# Patient Record
Sex: Male | Born: 1935 | Race: White | Hispanic: No | Marital: Single | Smoking: Never smoker
Health system: Southern US, Community
[De-identification: ages and names within clinical notes are randomized; demographics above are authoritative.]

## PROBLEM LIST (undated history)

## (undated) DIAGNOSIS — D689 Coagulation defect, unspecified: Secondary | ICD-10-CM

## (undated) DIAGNOSIS — I10 Essential (primary) hypertension: Secondary | ICD-10-CM

## (undated) DIAGNOSIS — N138 Other obstructive and reflux uropathy: Secondary | ICD-10-CM

## (undated) DIAGNOSIS — M1990 Unspecified osteoarthritis, unspecified site: Secondary | ICD-10-CM

## (undated) DIAGNOSIS — H269 Unspecified cataract: Secondary | ICD-10-CM

## (undated) DIAGNOSIS — K759 Inflammatory liver disease, unspecified: Secondary | ICD-10-CM

## (undated) DIAGNOSIS — K838 Other specified diseases of biliary tract: Secondary | ICD-10-CM

## (undated) DIAGNOSIS — N401 Enlarged prostate with lower urinary tract symptoms: Secondary | ICD-10-CM

## (undated) HISTORY — DX: Unspecified cataract: H26.9

## (undated) HISTORY — DX: Essential (primary) hypertension: I10

## (undated) HISTORY — DX: Coagulation defect, unspecified: D68.9

## (undated) HISTORY — DX: Other specified diseases of biliary tract: K83.8

## (undated) HISTORY — PX: OTHER SURGICAL HISTORY: SHX169

## (undated) HISTORY — DX: Benign prostatic hyperplasia with lower urinary tract symptoms: N40.1

## (undated) HISTORY — PX: HERNIA REPAIR: SHX51

## (undated) HISTORY — PX: EYE SURGERY: SHX253

## (undated) HISTORY — PX: CHOLECYSTECTOMY: SHX55

## (undated) HISTORY — DX: Unspecified osteoarthritis, unspecified site: M19.90

## (undated) HISTORY — DX: Other disorders of bilirubin metabolism: E80.6

## (undated) HISTORY — DX: Benign prostatic hyperplasia with lower urinary tract symptoms: N13.8

---

## 1998-09-30 ENCOUNTER — Encounter: Payer: Self-pay | Admitting: Family Medicine

## 1998-09-30 ENCOUNTER — Ambulatory Visit (HOSPITAL_COMMUNITY): Admission: RE | Admit: 1998-09-30 | Discharge: 1998-09-30 | Payer: Self-pay | Admitting: Family Medicine

## 2004-11-11 ENCOUNTER — Ambulatory Visit: Payer: Self-pay | Admitting: Gastroenterology

## 2004-11-19 ENCOUNTER — Ambulatory Visit: Payer: Self-pay | Admitting: Gastroenterology

## 2004-12-08 ENCOUNTER — Ambulatory Visit: Payer: Self-pay | Admitting: Oncology

## 2005-06-01 ENCOUNTER — Ambulatory Visit: Payer: Self-pay | Admitting: Oncology

## 2005-09-02 ENCOUNTER — Ambulatory Visit: Payer: Self-pay | Admitting: Oncology

## 2005-11-16 ENCOUNTER — Ambulatory Visit: Payer: Self-pay | Admitting: Oncology

## 2006-02-08 ENCOUNTER — Ambulatory Visit: Payer: Self-pay | Admitting: Oncology

## 2006-05-03 ENCOUNTER — Ambulatory Visit: Payer: Self-pay | Admitting: Oncology

## 2006-07-26 ENCOUNTER — Ambulatory Visit: Payer: Self-pay | Admitting: Oncology

## 2006-11-01 ENCOUNTER — Ambulatory Visit: Payer: Self-pay | Admitting: Oncology

## 2007-02-28 ENCOUNTER — Ambulatory Visit: Payer: Self-pay | Admitting: Oncology

## 2012-09-29 ENCOUNTER — Encounter: Payer: Self-pay | Admitting: Gastroenterology

## 2014-08-03 ENCOUNTER — Encounter: Payer: Self-pay | Admitting: Gastroenterology

## 2014-08-29 ENCOUNTER — Encounter: Payer: Self-pay | Admitting: Gastroenterology

## 2014-10-03 ENCOUNTER — Ambulatory Visit: Payer: Self-pay | Admitting: Gastroenterology

## 2014-10-03 ENCOUNTER — Encounter: Payer: Self-pay | Admitting: Gastroenterology

## 2014-10-03 ENCOUNTER — Ambulatory Visit (INDEPENDENT_AMBULATORY_CARE_PROVIDER_SITE_OTHER): Payer: 59 | Admitting: Gastroenterology

## 2014-10-03 ENCOUNTER — Other Ambulatory Visit (INDEPENDENT_AMBULATORY_CARE_PROVIDER_SITE_OTHER): Payer: 59

## 2014-10-03 VITALS — BP 120/82 | HR 66 | Ht 73.0 in | Wt 165.4 lb

## 2014-10-03 DIAGNOSIS — R932 Abnormal findings on diagnostic imaging of liver and biliary tract: Secondary | ICD-10-CM

## 2014-10-03 DIAGNOSIS — Z1211 Encounter for screening for malignant neoplasm of colon: Secondary | ICD-10-CM

## 2014-10-03 LAB — BUN: BUN: 14 mg/dL (ref 6–23)

## 2014-10-03 LAB — CREATININE, SERUM: Creatinine, Ser: 1 mg/dL (ref 0.4–1.5)

## 2014-10-03 MED ORDER — PEG-KCL-NACL-NASULF-NA ASC-C 100 G PO SOLR
1.0000 | Freq: Once | ORAL | Status: AC
Start: 2014-10-03 — End: 2014-11-02

## 2014-10-03 NOTE — Progress Notes (Signed)
    History of Present Illness: This is a 80 year old male accompanied by his wife. He is referred by Dr. Nyra Capes.  He underwent colonoscopy in December 2005 for Hemoccult-positive stool showing internal hemorrhoids and mild sigmoid diverticulosis. Recently on routine blood work she was found to have a very mild indirect hyperbilirubinemia. Minimally elevated total bilirubin of 1.7 and a normal direct bilirubin 0.34. The rest of his liver tests were entirely normal. Abd/pelvic CT scan imaging performed in August 2015 at [facility] showed a slight prominence of the intrahepatic and extrahepatic biliary ducts and a prior cholecystectomy. Common hepatic duct measured 12 mm, common bile duct 8-9 mm. He has mild constipation and rare episodes of diarrhea. His constipation has been controlled with regular use of Colace. Denies weight loss, abdominal pain, change in stool caliber, melena, hematochezia, nausea, vomiting, dysphagia, reflux symptoms, chest pain.   Review of Systems: Pertinent positive and negative review of systems were noted in the above HPI section. All other review of systems were otherwise negative. Current Medications, Allergies, Past Medical History, Past Surgical History, Family History and Social History were reviewed in [employer] record.  Physical Exam: General: Well developed , well nourished, no acute distress Head: Normocephalic and atraumatic Eyes:  sclerae anicteric, EOMI Ears: Normal auditory acuity Mouth: No deformity or lesions Neck: Supple, no masses or thyromegaly Lungs: Clear throughout to auscultation Heart: Regular rate and rhythm; no murmurs, rubs or bruits Abdomen: Soft, non tender and non distended. No masses, hepatosplenomegaly or hernias noted. Normal Bowel sounds Rectal: deferred to colonoscopy Musculoskeletal: Symmetrical with no gross deformities  Skin: No lesions on visible extremities Pulses:  Normal pulses noted Extremities:  No clubbing, cyanosis, edema or deformities noted Neurological: Alert oriented x 4, grossly nonfocal Cervical Nodes:  No significant cervical adenopathy Inguinal Nodes: No significant inguinal adenopathy Psychological:  Alert and cooperative. Normal mood and affect  Assessment and Recommendations:  1. Abnormal CT scan with a mildly dilated biliary tree. Post cholecystectomy. 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 Rule out obstructing strictures or lesions leading to biliary dilatation. Proceed with MRI/MRCP.  2. Colorectal cancer screening, average risk. Schedule colonoscopy. The risks, benefits, and alternatives to colonoscopy with possible biopsy and possible polypectomy were discussed with the patient and they consent to proceed.

## 2014-10-03 NOTE — Patient Instructions (Signed)
You have been scheduled for an MRI at [facility] on 10/12/14. Your appointment time is 5:00. Please arrive 15 minutes prior to your appointment time for registration purposes. Please make certain not to have anything to eat or drink 6 hours prior to your test. In addition, if you have any metal in your body, have a pacemaker or defibrillator, please be sure to let your ordering physician know. This test typically takes 45 minutes to 1 hour to complete.  Please go to the basement today for blood work  You have been scheduled for a colonoscopy, please see the separate instructions for details  Thank you for choosing Dr. Fuller Plan and [employer]

## 2014-10-04 ENCOUNTER — Encounter: Payer: Self-pay | Admitting: Gastroenterology

## 2014-10-12 ENCOUNTER — Other Ambulatory Visit (HOSPITAL_COMMUNITY): Payer: 59

## 2014-10-15 ENCOUNTER — Ambulatory Visit (HOSPITAL_COMMUNITY)
Admission: RE | Admit: 2014-10-15 | Discharge: 2014-10-15 | Disposition: A | Discharge status: Home / Self Care | Payer: No Typology Code available for payment source | Source: Ambulatory Visit | Attending: Gastroenterology | Admitting: Gastroenterology

## 2014-10-15 ENCOUNTER — Other Ambulatory Visit: Payer: Self-pay | Admitting: Gastroenterology

## 2014-10-15 DIAGNOSIS — K5900 Constipation, unspecified: Secondary | ICD-10-CM | POA: Diagnosis not present

## 2014-10-15 DIAGNOSIS — R932 Abnormal findings on diagnostic imaging of liver and biliary tract: Secondary | ICD-10-CM

## 2014-10-15 DIAGNOSIS — N281 Cyst of kidney, acquired: Secondary | ICD-10-CM | POA: Diagnosis not present

## 2014-10-15 DIAGNOSIS — K838 Other specified diseases of biliary tract: Secondary | ICD-10-CM | POA: Diagnosis not present

## 2014-10-15 DIAGNOSIS — R197 Diarrhea, unspecified: Secondary | ICD-10-CM | POA: Insufficient documentation

## 2014-10-15 MED ORDER — GADOBENATE DIMEGLUMINE 529 MG/ML IV SOLN
15.0000 mL | Freq: Once | INTRAVENOUS | Status: AC | PRN
  Administered 2014-10-15: 15 mL via INTRAVENOUS

## 2014-10-17 ENCOUNTER — Telehealth: Payer: Self-pay | Admitting: Gastroenterology

## 2014-10-17 ENCOUNTER — Encounter: Payer: 59 | Admitting: Gastroenterology

## 2014-10-17 NOTE — Telephone Encounter (Signed)
All questions answered

## 2014-10-17 NOTE — Telephone Encounter (Signed)
Left message for patient to call back  

## 2014-10-22 ENCOUNTER — Other Ambulatory Visit (HOSPITAL_COMMUNITY): Payer: 59

## 2014-11-27 ENCOUNTER — Ambulatory Visit (AMBULATORY_SURGERY_CENTER): Payer: 59 | Admitting: Gastroenterology

## 2014-11-27 ENCOUNTER — Encounter: Payer: Self-pay | Admitting: Gastroenterology

## 2014-11-27 VITALS — BP 128/71 | HR 54 | Temp 95.9°F | Resp 14 | Ht 73.0 in | Wt 165.0 lb

## 2014-11-27 DIAGNOSIS — Z1211 Encounter for screening for malignant neoplasm of colon: Secondary | ICD-10-CM

## 2014-11-27 MED ORDER — SODIUM CHLORIDE 0.9 % IV SOLN: 500.0000 mL | INTRAVENOUS | Status: DC

## 2014-11-27 NOTE — Patient Instructions (Signed)
YOU HAD AN ENDOSCOPIC PROCEDURE TODAY AT [facility]: Refer to the procedure report that was given to you for any specific questions about what was found during the examination.  If the procedure report does not answer your questions, please call your gastroenterologist to clarify.  If you requested that your care partner not be given the details of your procedure findings, then the procedure report has been included in a sealed envelope for you to review at your convenience later.  YOU SHOULD EXPECT: Some feelings of bloating in the abdomen. Passage of more gas than usual.  Walking can help get rid of the air that was put into your GI tract during the procedure and reduce the bloating. If you had a lower endoscopy (such as a colonoscopy or flexible sigmoidoscopy) you may notice spotting of blood in your stool or on the toilet paper. If you underwent a bowel prep for your procedure, then you may not have a normal bowel movement for a few days.  DIET: Your first meal following the procedure should be a light meal and then it is ok to progress to your normal diet.  A half-sandwich or bowl of soup is an example of a good first meal.  Heavy or fried foods are harder to digest and may make you feel nauseous or bloated.  Likewise meals heavy in dairy and vegetables can cause extra gas to form and this can also increase the bloating.  Drink plenty of fluids but you should avoid alcoholic beverages for 24 hours.  ACTIVITY: Your care partner should take you home directly after the procedure.  You should plan to take it easy, moving slowly for the rest of the day.  You can resume normal activity the day after the procedure however you should NOT DRIVE or use heavy machinery for 24 hours (because of the sedation medicines used during the test).    SYMPTOMS TO REPORT IMMEDIATELY: A gastroenterologist can be reached at any hour.  During normal business hours, 8:30 AM to 5:00 PM Monday through Friday,  call [phone].  After hours and on weekends, please call the GI answering service at [phone] who will take a message and have the physician on call contact you.   Following lower endoscopy (colonoscopy or flexible sigmoidoscopy):  Excessive amounts of blood in the stool  Significant tenderness or worsening of abdominal pains  Swelling of the abdomen that is new, acute  Fever of 100F or higher  FOLLOW UP: Our staff will call the home number listed on your records the next business day following your procedure to check on you and address any questions or concerns that you may have at that time regarding the information given to you following your procedure. This is a courtesy call and so if there is no answer at the home number and we have not heard from you through the emergency physician on call, we will assume that you have returned to your regular daily activities without incident.  SIGNATURES/CONFIDENTIALITY: You and/or your care partner have signed paperwork which will be entered into your electronic medical record.  These signatures attest to the fact that that the information above on your After Visit Summary has been reviewed and is understood.  Full responsibility of the confidentiality of this discharge information lies with you and/or your care-partner.  Please read over handouts about diverticulosis, hemorrhoids and high fiber diets  Push fluids  Continue your normal medications

## 2014-11-27 NOTE — Op Note (Addendum)
[place]  [address] [place], [id number]   COLONOSCOPY PROCEDURE REPORT  PATIENT: Bradley Vasquez, Bradley Vasquez  MR#: [id number] BIRTHDATE: [birth year] , 81  yrs. old GENDER: male ENDOSCOPIST: Ladene Artist, MD, [facility] PROCEDURE DATE:  11/27/2014 PROCEDURE:   Colonoscopy, screening First Screening Colonoscopy - Avg.  risk and is 50 yrs.  old or older - No.  Prior Negative Screening - Now for repeat screening. 10 or more years since last screening  History of Adenoma - Now for follow-up colonoscopy & has been > or = to 3 yrs.  N/A  Polyps Removed Today? No.  Polyps Removed Today? No.  Recommend repeat exam, <10 yrs? Polyps Removed Today? No.  Recommend repeat exam, <10 yrs? No. ASA CLASS:   Class II INDICATIONS:average risk for colorectal cancer. MEDICATIONS: Monitored anesthesia care and Propofol 160 mg IV DESCRIPTION OF PROCEDURE:   After the risks benefits and alternatives of the procedure were thoroughly explained, informed consent was obtained.  The digital rectal exam revealed no abnormalities of the rectum.   The LB [id number]  endoscope was introduced through the anus and advanced to the cecum, which was identified by both the appendix and ileocecal valve. No adverse events experienced.   The quality of the prep was good, using MoviPrep  The instrument was then slowly withdrawn as the colon was fully examined.    COLON FINDINGS: There was moderate diverticulosis noted in the descending colon and sigmoid colon with associated muscular hypertrophy.  Retroflexed views revealed internal Grade II hemorrhoids. The time to cecum=2 minutes 17 seconds.  Withdrawal time=8 minutes 08 seconds.  The scope was withdrawn and the procedure completed. COMPLICATIONS: There were no immediate complications.  ENDOSCOPIC IMPRESSION: 1. Moderate diverticulosis in the descending colon and sigmoid colon  2. Grade II internal hemorrhoids  RECOMMENDATIONS: 1.  High fiber diet  with liberal fluid intake. 2.  Given your age, you will not need another colonoscopy for colon cancer screening or polyp surveillance.  These types of tests usually stop around the age 37.  eSigned:  Ladene Artist, MD, [facility] 11/27/2014 2:26 PM Revised: 11/27/2014 2:26 PM  cc: Maryella Shivers, MD

## 2014-11-27 NOTE — Progress Notes (Signed)
Procedure ends, to recovery, report given and VSS. 

## 2014-11-27 NOTE — Progress Notes (Signed)
Reported to Dr. Fuller Plan that the pt ate a sausage bisqit for breakfast and a hamburger at 12:00 for lunch.  He said he did not know he was supposed to be on clear liquids yesterday.  Pt ststea his last result was a clear yellow liquid for the rectum. maw

## 2014-11-27 NOTE — Progress Notes (Signed)
Pt states that he didn't pass air while in the RR.  Air soft, easily palpable.  Pt denies pain or feeling of bloating.  I reminded him and his care partners to drink warm fluids and ambulate if he feels uncomfortable at home.  Understanding voiced

## 2014-11-28 ENCOUNTER — Telehealth: Payer: Self-pay | Admitting: *Deleted

## 2014-11-28 NOTE — Telephone Encounter (Signed)
  Follow up Call-  Call back number 11/27/2014  Post procedure Call Back phone  # [phone] hm  Permission to leave phone message Yes     Patient questions:  Do you have a fever, pain , or abdominal swelling? No. Pain Score  0 *  Have you tolerated food without any problems? Yes.    Have you been able to return to your normal activities? Yes.    Do you have any questions about your discharge instructions: Diet   No. Medications  No. Follow up visit  No.  Do you have questions or concerns about your Care? No.  Actions: * If pain score is 4 or above: No action needed, pain <4.

## 2014-11-30 HISTORY — PX: COLONOSCOPY: SHX174

## 2015-05-20 ENCOUNTER — Other Ambulatory Visit: Payer: Self-pay | Admitting: Urology

## 2015-06-07 NOTE — Patient Instructions (Addendum)
Shun Pletz  06/07/2015   Your procedure is scheduled on: Thursday 06/13/2015  Report to [facility] Main  Entrance take [place]  elevators to 3rd floor to  [place] at 0700 AM.  Call this number if you have problems the morning of surgery [phone]   Remember: ONLY 1 PERSON MAY GO WITH YOU TO SHORT STAY TO GET  READY MORNING OF [place].  Do not eat food or drink liquids :After Midnight.     Take these medicines the morning of surgery with A SIP OF WATER: Amlodipine                               You may not have any metal on your body including hair pins and              piercings  Do not wear jewelry, make-up, lotions, powders or perfumes, deodorant             Do not wear nail polish.  Do not shave  48 hours prior to surgery.              Men may shave face and neck.   Do not bring valuables to the hospital. [place].  Contacts, dentures or bridgework may not be worn into surgery.  Leave suitcase in the car. After surgery it may be brought to your room.     Patients discharged the day of surgery will not be allowed to drive home.  Name and phone number of your driver:  Special Instructions: N/A              Please read over the following fact sheets you were given: _____________________________________________________________________             [facility] - Preparing for Surgery Before surgery, you can play an important role.  Because skin is not sterile, your skin needs to be as free of germs as possible.  You can reduce the number of germs on your skin by washing with CHG (chlorahexidine gluconate) soap before surgery.  CHG is an antiseptic cleaner which kills germs and bonds with the skin to continue killing germs even after washing. Please DO NOT use if you have an allergy to CHG or antibacterial soaps.  If your skin becomes reddened/irritated stop using the CHG and inform your nurse  when you arrive at Short Stay. Do not shave (including legs and underarms) for at least 48 hours prior to the first CHG shower.  You may shave your face/neck. Please follow these instructions carefully:  1.  Shower with CHG Soap the night before surgery and the  morning of Surgery.  2.  If you choose to wash your hair, wash your hair first as usual with your  normal  shampoo.  3.  After you shampoo, rinse your hair and body thoroughly to remove the  shampoo.                           4.  Use CHG as you would any other liquid soap.  You can apply chg directly  to the skin and wash  Gently with a scrungie or clean washcloth.  5.  Apply the CHG Soap to your body ONLY FROM THE NECK DOWN.   Do not use on face/ open                           Wound or open sores. Avoid contact with eyes, ears mouth and genitals (private parts).                       Wash face,  Genitals (private parts) with your normal soap.             6.  Wash thoroughly, paying special attention to the area where your surgery  will be performed.  7.  Thoroughly rinse your body with warm water from the neck down.  8.  DO NOT shower/wash with your normal soap after using and rinsing off  the CHG Soap.                9.  Pat yourself dry with a clean towel.            10.  Wear clean pajamas.            11.  Place clean sheets on your bed the night of your first shower and do not  sleep with pets. Day of Surgery : Do not apply any lotions/deodorants the morning of surgery.  Please wear clean clothes to the hospital/surgery center.  FAILURE TO FOLLOW THESE INSTRUCTIONS MAY RESULT IN THE CANCELLATION OF YOUR SURGERY PATIENT SIGNATURE_________________________________  NURSE SIGNATURE__________________________________  ________________________________________________________________________

## 2015-06-10 ENCOUNTER — Encounter (HOSPITAL_COMMUNITY): Payer: Self-pay

## 2015-06-10 ENCOUNTER — Encounter (HOSPITAL_COMMUNITY)
Admission: RE | Admit: 2015-06-10 | Discharge: 2015-06-10 | Disposition: A | Discharge status: Home / Self Care | Payer: Medicare Other | Source: Ambulatory Visit | Attending: Urology | Admitting: Urology

## 2015-06-10 DIAGNOSIS — Z01812 Encounter for preprocedural laboratory examination: Secondary | ICD-10-CM | POA: Insufficient documentation

## 2015-06-10 DIAGNOSIS — I10 Essential (primary) hypertension: Secondary | ICD-10-CM | POA: Insufficient documentation

## 2015-06-10 DIAGNOSIS — Z01810 Encounter for preprocedural cardiovascular examination: Secondary | ICD-10-CM

## 2015-06-10 DIAGNOSIS — N400 Enlarged prostate without lower urinary tract symptoms: Secondary | ICD-10-CM | POA: Insufficient documentation

## 2015-06-10 HISTORY — DX: Inflammatory liver disease, unspecified: K75.9

## 2015-06-10 LAB — BASIC METABOLIC PANEL
Anion gap: 7 (ref 5–15)
BUN: 17 mg/dL (ref 6–20)
CO2: 28 mmol/L (ref 22–32)
CREATININE: 0.98 mg/dL (ref 0.61–1.24)
Calcium: 9.2 mg/dL (ref 8.9–10.3)
Chloride: 104 mmol/L (ref 101–111)
GFR calc non Af Amer: >60 mL/min (ref >60)
Glucose, Bld: 95 mg/dL (ref 65–99)
POTASSIUM: 4.2 mmol/L (ref 3.5–5.1)
Sodium: 139 mmol/L (ref 135–145)

## 2015-06-10 LAB — CBC
HCT: 39.4 % (ref 39.0–52.0)
Hemoglobin: 13.5 g/dL (ref 13.0–17.0)
MCH: 32.5 pg (ref 26.0–34.0)
MCHC: 34.3 g/dL (ref 30.0–36.0)
MCV: 94.9 fL (ref 78.0–100.0)
PLATELETS: 142 10*3/uL — AB (ref 150–400)
RBC: 4.15 MIL/uL — ABNORMAL LOW (ref 4.22–5.81)
RDW: 13 % (ref 11.5–15.5)
WBC: 5.7 10*3/uL (ref 4.0–10.5)

## 2015-06-10 LAB — APTT: APTT: 32 s (ref 24–37)

## 2015-06-10 LAB — ABO/RH: ABO/RH(D): O NEG

## 2015-06-10 LAB — PROTIME-INR
INR: 1.11 (ref 0.00–1.49)
Prothrombin Time: 14.5 seconds (ref 11.6–15.2)

## 2015-06-11 NOTE — Progress Notes (Signed)
Final EKG done 06/10/15 in [facility].

## 2015-06-12 NOTE — H&P (Signed)
Reason For Visit Cystoscopy, flowrate, PVR & prostate u/s   Active Problems Problems  1. Benign localized hyperplasia of prostate with urinary obstruction (N40.1,N13.8)   Assessed By: Carolan Clines (Urology); Last Assessed: 15 May 2015 2. Cellulitis Scrotum Left 3. Epididymitis, left (N45.1) 4. Fever (R50.9) 5. Nocturia (R35.1)   Assessed By: Carolan Clines (Urology); Last Assessed: 15 May 2015 6. PSA elevation (R97.2) 7. Straining on urination (R39.16) 8. Urethral polyp (N36.2)  History of Present Illness    81 YO male returns today for a cystoscopy, flowrate, PVR & prostate u/s. Hx of an elevated PSA. Originally referred back by Dr. Nyra Capes for further evaluation of an elevated PSA of 7.2 on 10/04/13. He has urgency, frequency, hesitancy & nocturia. He has had 2 weeks of antibiotics.      He has had a previous prostate biopsy 12/12/04 for an elevated PSA of 5.1. Prostate u/s was 55.85 grams. Pathology was negative.    05/08/15 PSA - 5.33/35%  05/08/14 PSA - 4.73/40%  11/01/13 PSA - 6.04/30%   Past Medical History Problems  1. History of arthritis (Z87.39) 2. History of hepatitis (Z86.19)  Surgical History Problems  1. History of Cholecystectomy 2. History of Inguinal Hernia Repair  Current Meds 1. AmLODIPine Besylate 10 MG Oral Tablet;  Therapy: 04Aug2014 to Recorded 2. Aspirin 81 MG TABS; TAKE TABLET  PRN;  Therapy: (Recorded:11Mar2016) to Recorded 3. Levofloxacin 500 MG Oral Tablet; TAKE 500 MG Daily;  Therapy: [id number] to (Evaluate:21Mar2016)  Requested for: [id number]; Last  Rx:14Mar2016 Ordered 4. Multiple Vitamin TABS;  Therapy: (Recorded:03Dec2014) to Recorded 5. Prostate CAPS;  Therapy: (Recorded:03Dec2014) to Recorded  Allergies Medication  1. No Known Drug Allergies  Family History Problems  1. No pertinent family history 2. No pertinent family history : Mother  Social History Problems  1. Denied: History of Alcohol use 2.  Caffeine use (F15.90)   3 per day 3. Father deceased 36. Married 5. Mother deceased 64. Never a smoker 7. Number of children   2 sons, 1 daughter 79. Retired  Review of Systems Genitourinary, constitutional, skin, eye, otolaryngeal, hematologic/lymphatic, cardiovascular, pulmonary, endocrine, musculoskeletal, gastrointestinal, neurological and psychiatric system(s) were reviewed and pertinent findings if present are noted and are otherwise negative.  Genitourinary: scrotal pain and scrotal swelling. redness    Vitals Vital Signs [Data Includes: Last 1 Day]  Recorded: [id number] 10:52AM  Blood Pressure: 163 / 76 Temperature: 96.8 F Heart Rate: 55  Physical Exam Constitutional: Well nourished and well developed . No acute distress.  ENT:. The ears and nose are normal in appearance.  Neck: The appearance of the neck is normal and no neck mass is present.  Pulmonary: No respiratory distress and normal respiratory rhythm and effort.  Cardiovascular: Heart rate and rhythm are normal . No peripheral edema.  Abdomen: The abdomen is flat. The abdomen is soft and nontender. No masses are palpated. No CVA tenderness. No hernias are palpable. No hepatosplenomegaly noted.  Rectal: Rectal exam demonstrates normal sphincter tone, no tenderness and no masses. Estimated prostate size is 4+. Normal rectal tone, no rectal masses, prostate is smooth, symmetric and non-tender. The prostate has no nodularity and is not tender. The left seminal vesicle is nonpalpable. The right seminal vesicle is nonpalpable. The perineum is normal on inspection.  Genitourinary: The penis is circumcised. The right epididymis is palpably normal. The left epididymis is tender and enlarged. The left spermatic cord is tender and swollen and found to have a ~Ucm mass. The right testis is palpably normal.  The left testis is normal.  Lymphatics: The femoral and inguinal nodes are not enlarged or tender.  Skin: Normal skin turgor, no  visible rash and no visible skin lesions.  Neuro/Psych:. Mood and affect are appropriate.    Results/Data  Selected Results  PSA REFLEX TO FREE [id number] 10:39AM Carolan Clines  SPECIMEN TYPE: BLOOD   Test Name Result Flag Reference  PSA 5.33 ng/mL H <=4.00  TEST METHODOLOGY: ECLIA PSA (ELECTROCHEMILUMINESCENCE IMMUNOASSAY)  PSA, FREE 1.87 ng/mL    PSA, %FREE 35 %  > 25  PROBABILITY OF PROSTATE CANCER   (FOR MEN WITH NON-SUSPICIOUS DRE RESULTS AND PSA BETWEEN 4 AND   10 NG/ML, BY PATIENT AGE)     % FREE PSA                          PATIENT AGE                          50 TO 59 YEARS  60 TO 69 YEARS  >70 YEARS    <=10%                  49.2%           57.5%          64.5%    11 - 18%               26.9%           33.9%          40.8%    19 - 25%               18.3%           23.9%          29.7%    >25%                    9.1%           12.2%          15.8%    15 May 2015 9:18 AM  UA With REFLEX    COLOR YELLOW     APPEARANCE CLEAR     SPECIFIC GRAVITY 1.015     pH 6.5     GLUCOSE NEG     BILIRUBIN NEG     KETONE NEG     BLOOD NEG     PROTEIN NEG     UROBILINOGEN 0.2     NITRITE NEG     LEUKOCYTE ESTERASE NEG  Procedure PUS: L : 6.13; H: 5.55cm; W: 6.41cm; Volume: 114.06 cm.  PVR: 39.15cc  bladder wall: 0.53cm.   Procedure: Cystoscopy  Chaperone Present: Mickel Baas.  Indication: Lower Urinary Tract Symptoms.  Informed Consent: Risks, benefits, and potential adverse events were discussed and informed consent was obtained from the patient.  Prep: The patient was prepped with betadine.  Anesthesia:. Local anesthesia was administered intraurethrally with 2% lidocaine jelly.  Antibiotic prophylaxis: Ciprofloxacin.  Procedure Note:  Urethral meatus:. No abnormalities.  Prostatic urethra:. There was visual obstruction of the prostatic urethra. The lateral and median prostatic lobes were enlarged. No intravesical median lobe was visualized. Prostatic urethral polyps.   Bladder: Visulization was clear. The ureteral orifices were in the normal anatomic position bilaterally. Examination of the bladder demonstrated mild, grade 1 trabeculation and a diverticulum cellules. Multiple stones were identified in the bladder. A stone was present in the  bladder measuring approximately 2 cm in size. The patient tolerated the procedure well.  Complications: None.    Assessment Assessed  1. Benign localized hyperplasia of prostate with urinary obstruction (N40.1,N13.8) 2. Nocturia (R35.1) 3. Straining on urination (R39.16) 4. Urethral polyp (N36.2) 5. PSA elevation (R97.2)  81 yo male, with BPH, bladder outlet symptoms, with IPSS= 11; and elevated PSA of 7.2, with remote biopsy showing BPH only. He has Prostate volume=114.06cc, and peak Flow rate of only 2cc/sec, and pvr=39.15cc. Cysto shows very large prostate, with only small amount of intravesical intrusion.    He will need retropubic prostatectomy, and physical therapy pre and post op ( Medicare A and postal insurance). .   Plan Benign localized hyperplasia of prostate with urinary obstruction  1. PT/OT Referral Referral  Referral: pre op simple retropubic prostatedctomy.  Status: Hold  For - Appointment,PreCert,Date of Service,Physical Therapy  Requested for: [id number]  Schedule surgery.  Pre-op Physical Therapy ( discussd with patient).   Discussion/Summary cc: Dr. Nyra Capes   A total of 60 minutes were spent in the overall care of the patient today with 45 minutes in direct face to face consultation.    Signatures Electronically signed by : Carolan Clines, M.D.; May 15 2015 11:59AM EST

## 2015-06-13 ENCOUNTER — Inpatient Hospital Stay (HOSPITAL_COMMUNITY): Payer: Medicare Other | Admitting: Certified Registered Nurse Anesthetist

## 2015-06-13 ENCOUNTER — Inpatient Hospital Stay (HOSPITAL_COMMUNITY)
Admission: RE | Admit: 2015-06-13 | Discharge: 2015-06-16 | DRG: 707 | Disposition: A | Discharge status: Home / Self Care | Payer: Medicare Other | Source: Ambulatory Visit | Attending: Urology | Admitting: Urology

## 2015-06-13 ENCOUNTER — Encounter (HOSPITAL_COMMUNITY): Payer: Self-pay | Admitting: *Deleted

## 2015-06-13 ENCOUNTER — Encounter (HOSPITAL_COMMUNITY)
Admission: RE | Disposition: A | Discharge status: Home / Self Care | Payer: Self-pay | Source: Ambulatory Visit | Attending: Urology

## 2015-06-13 DIAGNOSIS — N138 Other obstructive and reflux uropathy: Secondary | ICD-10-CM | POA: Diagnosis present

## 2015-06-13 DIAGNOSIS — Z01810 Encounter for preprocedural cardiovascular examination: Secondary | ICD-10-CM

## 2015-06-13 DIAGNOSIS — D62 Acute posthemorrhagic anemia: Secondary | ICD-10-CM | POA: Diagnosis not present

## 2015-06-13 DIAGNOSIS — N210 Calculus in bladder: Secondary | ICD-10-CM | POA: Diagnosis present

## 2015-06-13 DIAGNOSIS — N362 Urethral caruncle: Secondary | ICD-10-CM | POA: Diagnosis present

## 2015-06-13 DIAGNOSIS — B354 Tinea corporis: Secondary | ICD-10-CM | POA: Diagnosis present

## 2015-06-13 DIAGNOSIS — Z79899 Other long term (current) drug therapy: Secondary | ICD-10-CM

## 2015-06-13 DIAGNOSIS — D693 Immune thrombocytopenic purpura: Secondary | ICD-10-CM | POA: Diagnosis present

## 2015-06-13 DIAGNOSIS — Z01812 Encounter for preprocedural laboratory examination: Secondary | ICD-10-CM

## 2015-06-13 DIAGNOSIS — N400 Enlarged prostate without lower urinary tract symptoms: Secondary | ICD-10-CM

## 2015-06-13 DIAGNOSIS — N359 Urethral stricture, unspecified: Secondary | ICD-10-CM | POA: Diagnosis present

## 2015-06-13 DIAGNOSIS — N401 Enlarged prostate with lower urinary tract symptoms: Secondary | ICD-10-CM | POA: Diagnosis present

## 2015-06-13 DIAGNOSIS — I10 Essential (primary) hypertension: Secondary | ICD-10-CM | POA: Diagnosis present

## 2015-06-13 DIAGNOSIS — M1990 Unspecified osteoarthritis, unspecified site: Secondary | ICD-10-CM | POA: Diagnosis present

## 2015-06-13 DIAGNOSIS — Z7982 Long term (current) use of aspirin: Secondary | ICD-10-CM

## 2015-06-13 DIAGNOSIS — Z9049 Acquired absence of other specified parts of digestive tract: Secondary | ICD-10-CM | POA: Diagnosis present

## 2015-06-13 DIAGNOSIS — C61 Malignant neoplasm of prostate: Secondary | ICD-10-CM | POA: Diagnosis present

## 2015-06-13 DIAGNOSIS — N320 Bladder-neck obstruction: Secondary | ICD-10-CM | POA: Diagnosis present

## 2015-06-13 DIAGNOSIS — N451 Epididymitis: Secondary | ICD-10-CM | POA: Diagnosis present

## 2015-06-13 HISTORY — PX: PROSTATECTOMY: SHX69

## 2015-06-13 LAB — TYPE AND SCREEN
ABO/RH(D): O NEG
Antibody Screen: NEGATIVE

## 2015-06-13 LAB — HEMOGLOBIN AND HEMATOCRIT, BLOOD
HCT: 34.2 % — ABNORMAL LOW (ref 39.0–52.0)
Hemoglobin: 12.1 g/dL — ABNORMAL LOW (ref 13.0–17.0)

## 2015-06-13 SURGERY — PROSTATECTOMY, RETROPUBIC
Anesthesia: General

## 2015-06-13 MED ORDER — GLYCOPYRROLATE 0.2 MG/ML IJ SOLN
INTRAMUSCULAR | Status: DC | PRN
  Administered 2015-06-13: .6 mg via INTRAVENOUS

## 2015-06-13 MED ORDER — NEOSTIGMINE METHYLSULFATE 10 MG/10ML IV SOLN
INTRAVENOUS | Status: DC | PRN
  Administered 2015-06-13: 4 mg via INTRAVENOUS

## 2015-06-13 MED ORDER — DIPHENHYDRAMINE HCL 12.5 MG/5ML PO ELIX
12.5000 mg | ORAL_SOLUTION | Freq: Four times a day (QID) | ORAL | Status: DC | PRN
Start: 2015-06-13 — End: 2015-06-16

## 2015-06-13 MED ORDER — PSYLLIUM 95 % PO PACK
1.0000 | PACK | Freq: Every day | ORAL | Status: DC
  Administered 2015-06-13 – 2015-06-15 (×3): 1 via ORAL
  Filled 2015-06-13 (×5): qty 1

## 2015-06-13 MED ORDER — OXYBUTYNIN CHLORIDE 5 MG PO TABS: 5.0000 mg | ORAL_TABLET | Freq: Three times a day (TID) | ORAL | Status: DC | PRN

## 2015-06-13 MED ORDER — DOCUSATE SODIUM 100 MG PO CAPS: 100.0000 mg | ORAL_CAPSULE | Freq: Two times a day (BID) | ORAL | Status: DC

## 2015-06-13 MED ORDER — FENTANYL CITRATE (PF) 100 MCG/2ML IJ SOLN
INTRAMUSCULAR | Status: DC | PRN
  Administered 2015-06-13: 50 ug via INTRAVENOUS
  Administered 2015-06-13 (×2): 100 ug via INTRAVENOUS
  Administered 2015-06-13 (×4): 50 ug via INTRAVENOUS

## 2015-06-13 MED ORDER — LIDOCAINE HCL (CARDIAC) 20 MG/ML IV SOLN
INTRAVENOUS | Status: DC | PRN
  Administered 2015-06-13: 100 mg via INTRAVENOUS

## 2015-06-13 MED ORDER — ACETAMINOPHEN 10 MG/ML IV SOLN
INTRAVENOUS | Status: DC | PRN
  Administered 2015-06-13: 1000 mg via INTRAVENOUS

## 2015-06-13 MED ORDER — ACETAMINOPHEN 325 MG PO TABS: 650.0000 mg | ORAL_TABLET | ORAL | Status: DC | PRN

## 2015-06-13 MED ORDER — PROPOFOL 10 MG/ML IV BOLUS
INTRAVENOUS | Status: AC
  Filled 2015-06-13: qty 20

## 2015-06-13 MED ORDER — DOCUSATE SODIUM 100 MG PO CAPS
100.0000 mg | ORAL_CAPSULE | Freq: Two times a day (BID) | ORAL | Status: DC
  Administered 2015-06-13 – 2015-06-15 (×5): 100 mg via ORAL
  Filled 2015-06-13 (×6): qty 1

## 2015-06-13 MED ORDER — CEFAZOLIN SODIUM 1-5 GM-% IV SOLN: 1.0000 g | Freq: Three times a day (TID) | INTRAVENOUS | Status: DC

## 2015-06-13 MED ORDER — EPHEDRINE SULFATE 50 MG/ML IJ SOLN
INTRAMUSCULAR | Status: AC
  Filled 2015-06-13: qty 1

## 2015-06-13 MED ORDER — BELLADONNA ALKALOIDS-OPIUM 16.2-60 MG RE SUPP: 1.0000 | Freq: Four times a day (QID) | RECTAL | Status: DC | PRN

## 2015-06-13 MED ORDER — LIDOCAINE HCL (CARDIAC) 20 MG/ML IV SOLN
INTRAVENOUS | Status: AC
  Filled 2015-06-13: qty 5

## 2015-06-13 MED ORDER — ROCURONIUM BROMIDE 100 MG/10ML IV SOLN
INTRAVENOUS | Status: DC | PRN
  Administered 2015-06-13: 10 mg via INTRAVENOUS
  Administered 2015-06-13: 50 mg via INTRAVENOUS
  Administered 2015-06-13: 10 mg via INTRAVENOUS

## 2015-06-13 MED ORDER — OXYBUTYNIN CHLORIDE 5 MG PO TABS: 5.0000 mg | ORAL_TABLET | Freq: Three times a day (TID) | ORAL | Status: DC

## 2015-06-13 MED ORDER — MORPHINE SULFATE 2 MG/ML IJ SOLN: 2.0000 mg | INTRAMUSCULAR | Status: DC | PRN

## 2015-06-13 MED ORDER — DEXAMETHASONE SODIUM PHOSPHATE 10 MG/ML IJ SOLN
INTRAMUSCULAR | Status: AC
  Filled 2015-06-13: qty 1

## 2015-06-13 MED ORDER — ACETAMINOPHEN 10 MG/ML IV SOLN
INTRAVENOUS | Status: AC
  Filled 2015-06-13: qty 100

## 2015-06-13 MED ORDER — MEPERIDINE HCL 50 MG/ML IJ SOLN: 6.2500 mg | INTRAMUSCULAR | Status: DC | PRN

## 2015-06-13 MED ORDER — KCL IN DEXTROSE-NACL 20-5-0.45 MEQ/L-%-% IV SOLN
INTRAVENOUS | Status: DC
  Administered 2015-06-13 – 2015-06-14 (×3): via INTRAVENOUS
  Filled 2015-06-13 (×5): qty 1000

## 2015-06-13 MED ORDER — ONDANSETRON HCL 4 MG/2ML IJ SOLN
INTRAMUSCULAR | Status: AC
  Filled 2015-06-13: qty 2

## 2015-06-13 MED ORDER — BUPIVACAINE LIPOSOME 1.3 % IJ SUSP
20.0000 mL | Freq: Once | INTRAMUSCULAR | Status: AC
  Administered 2015-06-13: 20 mL
  Filled 2015-06-13: qty 20

## 2015-06-13 MED ORDER — PROPOFOL 10 MG/ML IV BOLUS
INTRAVENOUS | Status: DC | PRN
  Administered 2015-06-13: 150 mg via INTRAVENOUS

## 2015-06-13 MED ORDER — OXYCODONE-ACETAMINOPHEN 5-325 MG PO TABS: 1.0000 | ORAL_TABLET | ORAL | Status: DC | PRN

## 2015-06-13 MED ORDER — FENTANYL CITRATE (PF) 100 MCG/2ML IJ SOLN: 25.0000 ug | INTRAMUSCULAR | Status: DC | PRN

## 2015-06-13 MED ORDER — FENTANYL CITRATE (PF) 250 MCG/5ML IJ SOLN
INTRAMUSCULAR | Status: AC
  Filled 2015-06-13: qty 5

## 2015-06-13 MED ORDER — LACTATED RINGERS IV SOLN
INTRAVENOUS | Status: AC
  Administered 2015-06-13: 1000 mL via INTRAVENOUS

## 2015-06-13 MED ORDER — CEFAZOLIN SODIUM 1-5 GM-% IV SOLN
1.0000 g | Freq: Three times a day (TID) | INTRAVENOUS | Status: DC
  Administered 2015-06-13 – 2015-06-15 (×5): 1 g via INTRAVENOUS
  Filled 2015-06-13 (×7): qty 50

## 2015-06-13 MED ORDER — DIPHENHYDRAMINE HCL 50 MG/ML IJ SOLN: 12.5000 mg | Freq: Four times a day (QID) | INTRAMUSCULAR | Status: DC | PRN

## 2015-06-13 MED ORDER — OXYCODONE-ACETAMINOPHEN 5-325 MG PO TABS
1.0000 | ORAL_TABLET | Freq: Four times a day (QID) | ORAL | Status: DC | PRN
  Administered 2015-06-15: 1 via ORAL
  Filled 2015-06-13: qty 1

## 2015-06-13 MED ORDER — SENNA 8.6 MG PO TABS: 2.0000 | ORAL_TABLET | Freq: Every day | ORAL | Status: DC

## 2015-06-13 MED ORDER — PROMETHAZINE HCL 25 MG/ML IJ SOLN: 6.2500 mg | INTRAMUSCULAR | Status: DC | PRN

## 2015-06-13 MED ORDER — PHENYLEPHRINE HCL 10 MG/ML IJ SOLN
INTRAMUSCULAR | Status: DC | PRN
  Administered 2015-06-13: 80 ug via INTRAVENOUS
  Administered 2015-06-13 (×2): 40 ug via INTRAVENOUS

## 2015-06-13 MED ORDER — EPHEDRINE SULFATE 50 MG/ML IJ SOLN
INTRAMUSCULAR | Status: DC | PRN
  Administered 2015-06-13: 5 mg via INTRAVENOUS
  Administered 2015-06-13: 10 mg via INTRAVENOUS
  Administered 2015-06-13 (×2): 5 mg via INTRAVENOUS

## 2015-06-13 MED ORDER — CEFAZOLIN SODIUM-DEXTROSE 2-3 GM-% IV SOLR
2.0000 g | INTRAVENOUS | Status: AC
  Administered 2015-06-13: 2 g via INTRAVENOUS

## 2015-06-13 MED ORDER — KETOROLAC TROMETHAMINE 15 MG/ML IJ SOLN
15.0000 mg | Freq: Four times a day (QID) | INTRAMUSCULAR | Status: AC
  Administered 2015-06-13 – 2015-06-15 (×6): 15 mg via INTRAVENOUS
  Filled 2015-06-13 (×9): qty 1

## 2015-06-13 MED ORDER — ONDANSETRON HCL 4 MG/2ML IJ SOLN
4.0000 mg | INTRAMUSCULAR | Status: DC | PRN
  Administered 2015-06-13: 4 mg via INTRAVENOUS
  Filled 2015-06-13: qty 2

## 2015-06-13 MED ORDER — CEFAZOLIN SODIUM-DEXTROSE 2-3 GM-% IV SOLR
INTRAVENOUS | Status: AC
  Filled 2015-06-13: qty 50

## 2015-06-13 MED ORDER — ONDANSETRON HCL 4 MG/2ML IJ SOLN
INTRAMUSCULAR | Status: DC | PRN
  Administered 2015-06-13: 4 mg via INTRAVENOUS

## 2015-06-13 MED ORDER — OXYMETAZOLINE HCL 0.05 % NA SOLN
1.0000 | Freq: Two times a day (BID) | NASAL | Status: DC | PRN
  Administered 2015-06-13: 1 via NASAL
  Filled 2015-06-13: qty 15

## 2015-06-13 MED ORDER — MIDAZOLAM HCL 2 MG/2ML IJ SOLN
INTRAMUSCULAR | Status: AC
  Filled 2015-06-13: qty 2

## 2015-06-13 MED ORDER — LACTATED RINGERS IV SOLN
INTRAVENOUS | Status: DC | PRN
  Administered 2015-06-13 (×3): via INTRAVENOUS

## 2015-06-13 MED ORDER — BACITRACIN-NEOMYCIN-POLYMYXIN 400-5-5000 EX OINT: 1.0000 "application " | TOPICAL_OINTMENT | Freq: Three times a day (TID) | CUTANEOUS | Status: DC | PRN

## 2015-06-13 MED ORDER — ROCURONIUM BROMIDE 100 MG/10ML IV SOLN
INTRAVENOUS | Status: AC
  Filled 2015-06-13: qty 1

## 2015-06-13 MED ORDER — KETOROLAC TROMETHAMINE 30 MG/ML IJ SOLN
INTRAMUSCULAR | Status: DC | PRN
  Administered 2015-06-13: 30 mg via INTRAVENOUS

## 2015-06-13 MED ORDER — SULFAMETHOXAZOLE-TRIMETHOPRIM 800-160 MG PO TABS: 1.0000 | ORAL_TABLET | Freq: Two times a day (BID) | ORAL | Status: DC

## 2015-06-13 SURGICAL SUPPLY — 57 items
BAG URINE DRAINAGE (UROLOGICAL SUPPLIES) ×3 IMPLANT
BLADE EXTENDED COATED 6.5IN (ELECTRODE) ×3 IMPLANT
BLADE HEX COATED 2.75 (ELECTRODE) ×3 IMPLANT
BLADE SURG 15 STRL LF DISP TIS (BLADE) ×1 IMPLANT
BLADE SURG 15 STRL SS (BLADE) ×3
CATH FOLEY 2WAY SLVR 30CC 22FR (CATHETERS) ×1 IMPLANT
CATH HEMATURIA 20FR (CATHETERS) ×2 IMPLANT
CATH SILASTIC FOLEY 20FRX30CC (CATHETERS) ×5 IMPLANT
CLIP LIGATING HEM O LOK PURPLE (MISCELLANEOUS) ×5 IMPLANT
CLIP LIGATING HEMO O LOK GREEN (MISCELLANEOUS) ×3 IMPLANT
COVER SURGICAL LIGHT HANDLE (MISCELLANEOUS) ×1 IMPLANT
DISSECTOR ROUND CHERRY 3/8 STR (MISCELLANEOUS) ×3 IMPLANT
DRAIN CHANNEL 10F 3/8 F FF (DRAIN) ×1 IMPLANT
DRAIN CHANNEL 19F RND (DRAIN) ×2 IMPLANT
DRAPE LAPAROTOMY T 102X78X121 (DRAPES) ×3 IMPLANT
DRAPE TABLE BACK 44X90 PK DISP (DRAPES) ×1 IMPLANT
DRAPE UTILITY 15X26 (DRAPE) ×3 IMPLANT
DRAPE WARM FLUID 44X44 (DRAPE) ×3 IMPLANT
DRSG OPSITE POSTOP 4X8 (GAUZE/BANDAGES/DRESSINGS) ×2 IMPLANT
ELECT REM PT RETURN 9FT ADLT (ELECTROSURGICAL) ×3
ELECTRODE REM PT RTRN 9FT ADLT (ELECTROSURGICAL) ×1 IMPLANT
EVACUATOR SILICONE 100CC (DRAIN) ×3 IMPLANT
FLOSEAL 10ML (HEMOSTASIS) ×2 IMPLANT
GAUZE SPONGE 4X4 12PLY STRL (GAUZE/BANDAGES/DRESSINGS) ×1 IMPLANT
GAUZE SPONGE 4X4 16PLY XRAY LF (GAUZE/BANDAGES/DRESSINGS) ×4 IMPLANT
GLOVE BIOGEL M STRL SZ7.5 (GLOVE) ×17 IMPLANT
GOWN STRL REUS W/TWL XL LVL3 (GOWN DISPOSABLE) ×7 IMPLANT
HOLDER FOLEY CATH W/STRAP (MISCELLANEOUS) ×3 IMPLANT
KIT BASIN OR (CUSTOM PROCEDURE TRAY) ×3 IMPLANT
LIQUID BAND (GAUZE/BANDAGES/DRESSINGS) ×2 IMPLANT
NS IRRIG 1000ML POUR BTL (IV SOLUTION) ×6 IMPLANT
PACK GENERAL/GYN (CUSTOM PROCEDURE TRAY) ×3 IMPLANT
PLUG CATH AND CAP STER (CATHETERS) ×3 IMPLANT
SCRUB PCMX 4 OZ (MISCELLANEOUS) ×1 IMPLANT
SPONGE LAP 18X18 X RAY DECT (DISPOSABLE) ×6 IMPLANT
SPONGE LAP 4X18 X RAY DECT (DISPOSABLE) ×3 IMPLANT
STAPLER VISISTAT 35W (STAPLE) ×1 IMPLANT
SURGILUBE 3G PEEL PACK STRL (MISCELLANEOUS) ×5 IMPLANT
SUT ETHILON 3 0 PS 1 (SUTURE) ×3 IMPLANT
SUT MNCRL AB 4-0 PS2 18 (SUTURE) ×2 IMPLANT
SUT PDS AB 1 CTX 36 (SUTURE) ×6 IMPLANT
SUT SILK 0 (SUTURE) ×3
SUT SILK 0 30XBRD TIE 6 (SUTURE) ×2 IMPLANT
SUT VIC AB 0 CT1 27 (SUTURE)
SUT VIC AB 0 CT1 27XBRD ANTBC (SUTURE) ×3 IMPLANT
SUT VIC AB 0 UR5 27 (SUTURE) ×2 IMPLANT
SUT VIC AB 2-0 UR5 27 (SUTURE) ×8 IMPLANT
SUT VIC AB 3-0 SH 27 (SUTURE) ×15
SUT VIC AB 3-0 SH 27X BRD (SUTURE) IMPLANT
SUT VIC AB 4-0 RB1 27 (SUTURE)
SUT VIC AB 4-0 RB1 27XBRD (SUTURE) ×3 IMPLANT
SYR 30ML LL (SYRINGE) ×3 IMPLANT
TAPE CLOTH SURG 4X10 WHT LF (GAUZE/BANDAGES/DRESSINGS) ×1 IMPLANT
TAPE UMBILICAL COTTON 1/8X30 (MISCELLANEOUS) ×1 IMPLANT
TOWEL OR 17X26 10 PK STRL BLUE (TOWEL DISPOSABLE) ×4 IMPLANT
TOWEL OR NON WOVEN STRL DISP B (DISPOSABLE) ×3 IMPLANT
WATER STERILE IRR 1500ML POUR (IV SOLUTION) ×1 IMPLANT

## 2015-06-13 NOTE — Op Note (Signed)
Post-operative Diagnosis:  1. Meatal stenosis 2. 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 Cystolithiasis  Procedure and Anesthesia:  Procedure(s) and Anesthesia Type:    *  OPEN SURPAPUBIC PROSTATECTOMY, AND CYSTOLITHOLAPAXY  - General 1. Dilation of meatal stenosis 2. Open suprapubic simple prostatectomy (transvesical) 3. Open cystolitholapaxy for stone burden <2 cm 4. Simple foley catheter placement  Surgeon: Surgeon(s) and Role:    * Carolan Clines, MD - Primary   Resident:  Star Age, MD  EBL: 700 mL  IVF: See anesthesia record  Drains:  1. 20 Fr 3-way hematuria catheter with 50 cc sterile water in the balloon port 2. 19 Fr round blake drain in the LEFT flank draining the pelivs  Implants: * No implants in log *  Specimens:  1. Prostate adenoma (Base labelled with long black stitch, RIGHT apex labelled with short black stitch) 2. Bladder stones for chemical analysis  Complications: * No complications entered in OR log *  Indications for Surgery: 81 y.o. male with BPH with severe obstruction and associated cystolithiasis, trabeculations and voiding dysfunction. Please see prior H&P for full details. Risks, benefits, and alternatives of the above procedure were discussed previously in detail and informed consents was signed and verified.  Findings:  1. Meatal stenosis dilated to 66 [place] using male sounds 2. Cystolithiasis with roughly 1.5 cm of stone 3. Bladder trabeculated with multiple diverticuli which also contained stones. 4. Prostate adenoma cored out subcapsular, no median lobe component, bilateral ureteral orifices identified and uninvolved in resection 5. FloSeal placed in the prostatic fossa with adequate hemostasis 6. Successful placement of a 20 [place] 3-way hematuria catheter draining Kool-Aid colored thin urine, irrigated without difficulty prior to leaving the operating room  Procedure Details: The patient and consent was  verified in the pre-op holding area and brought to the operating room where they were placed on the operating table. Pre-induction time out was called and general anesthesia was induced. SCDs were placed and IV antibiotics were started.  The patient was positioned with a bump under his sacrum and the bed was flexed to give adequate exposure of the pelvis. He was prepped and draped in the usual sterile fashion using chlor prep and Betadine. Of note we noted evidence of tinea corporis along his lateral flanks outside of the incision line.  We began by tapping the place a 22 [place] three-way hematuria catheter which was difficult given evidence of meatal stenosis. We dilated his meatus to 31 [place] using male urethral sounds, however we still struggle to place the catheter so we opted to place a 20 [place] 2-way silicon catheter. We then marked out an 8 cm incision from the top of the pubic symphysis to the semilunar line just beneath the umbilicus. The skin was incised with a #10 blade scalpel and the subcutaneous tissues were dissected down using a combination of blunt dissection and Bovie electrocautery. The rectus muscle was split in the midline and the posterior rectus sheath was identified. We dissected down through the posterior rectus sheath into the space of Retzius.  We then set up our Omni retractor to provide adequate exposure of the pelvis. We bluntly took down the lateral attachments to the bladder. We then placed 2 stay sutures using #4-0 black role on either side of the mid bladder and created a cystotomy between the sutures after we had filled the bladder with fluid through the catheter. The bladder was then examined and was noted to be trabeculated with moderate diverticuli and  stone fragments measuring less than 2 cm. There was a small median lobe component of the prostate within the bladder. The ureteral orifices were identified bilaterally and seen to effluxed clear yellow urine. We then  proceeded to irrigate and pull out all visible stone which were sent for chemical analysis.  We then scored the area of prostate that was intravesical in circumferential fashion and dissected down to the layer between capsule and adenoma using Metzenbaum scissors initially posteriorly until we had created enough of the space to perform the remainder of the dissection bluntly using finger fracture technique. The prostate was cored out in its entirety and sent off for permanent pathology as labeled above. We then turned our attention to hemostasis and noted a small roughly 3 cm rent in the left lateral prostatic capsule. We suture ligated a bleeding vein and felt that excellent hemostasis had been achieved to that point.  We then elected to place a 20 [place] 3-way hematuria catheter  after removing the 20 [place] 2-way catheter.  Placement was confirmed visually within the bladder and 30 mL of sterile water was instilled. We then proceeded to close the bladder in 2 layers using #3-0 Vicryl suture. Once close we turned our attention back to the prostatic fossa which we again irrigated and felt to be relatively hemostatic. We then instilled 30 mL of FloSeal and let it sit in place for roughly 3 minutes. We then closed the prostatic capsule with #3-0 vicryl suture. We then again irrigated the pelvis until adequate hemostasis had been achieved.  We then placed a 19 [place] round Blake drain with the exit hole in the left lower quadrant and the drain draped over the left hemipelvis. The drain was sewed in place with a #3-0 nylon suture. We then injected Exparel into the subfascial layers We then proceeded with closing. The rectus muscle was loosely reapproximated with #4-0 black interrupted suture. The rectus fascia was closed with #1-0 PDS suture in running fashion. The subcuticular layers were brought together using interrupted #4-0 black suture and the skin was closed using a #4-0 Monocryl suture. Liquaband was  placed over the suprapubic incision.  An additional 20 mL of sterile water were instilled and the Foley balloon, the catheter was irrigated which revealed Kool-aid colored thin urine without clots.  The wounds were dressed in standard fashion. Anesthesia was reversed and the patient awoke having tolerated the procedure well. He was taken to the PACU in stable condition.  Teaching Physician Attestation: Dr. Gaynelle Arabian was present and scrubbed for key portions of the and and scrubbed for the entirety of the procedure  Pietro Cassis. Ottis Stain, MD Resident [facility] Department of Urologic Surgery/[facility]

## 2015-06-13 NOTE — Anesthesia Postprocedure Evaluation (Signed)
  Anesthesia Post-op Note  Patient: Bradley Vasquez  Procedure(s) Performed: Procedure(s) (LRB):  OPEN SURPAPUBIC PROSTATECTOMY, AND CYSTOLITHOLAPAXY  (N/A)  Patient Location: PACU  Anesthesia Type: General  Level of Consciousness: awake and alert   Airway and Oxygen Therapy: Patient Spontanous Breathing  Post-op Pain: mild  Post-op Assessment: Post-op Vital signs reviewed, Patient's Cardiovascular Status Stable, Respiratory Function Stable, Patent Airway and No signs of Nausea or vomiting  Last Vitals:  Filed Vitals:   06/13/15 1345  BP: 126/62  Pulse: 63  Temp: 36.3 C  Resp: 12    Post-op Vital Signs: stable   Complications: No apparent anesthesia complications

## 2015-06-13 NOTE — Anesthesia Procedure Notes (Signed)
Procedure Name: Intubation Date/Time: 06/13/2015 9:14 AM Performed by: Gean Maidens Pre-anesthesia Checklist: Patient identified, Emergency Drugs available, Suction available and Patient being monitored Patient Re-evaluated:Patient Re-evaluated prior to inductionOxygen Delivery Method: Circle system utilized Preoxygenation: Pre-oxygenation with 100% oxygen Intubation Type: IV induction Ventilation: Oral airway inserted - appropriate to patient size Laryngoscope Size: Mac and 3 Grade View: Grade II Tube type: Oral Tube size: 7.5 mm Number of attempts: 1 Airway Equipment and Method: Stylet Placement Confirmation: ETT inserted through vocal cords under direct vision,  positive ETCO2 and CO2 detector Secured at: 22 cm Tube secured with: Tape Dental Injury: Injury to lip

## 2015-06-13 NOTE — Transfer of Care (Signed)
Immediate Anesthesia Transfer of Care Note  Patient: Bradley Vasquez  Procedure(s) Performed: Procedure(s):  OPEN SURPAPUBIC PROSTATECTOMY, AND CYSTOLITHOLAPAXY  (N/A)  Patient Location: PACU  Anesthesia Type:General  Level of Consciousness: awake, patient cooperative and responds to stimulation  Airway & Oxygen Therapy: Patient Spontanous Breathing and Patient connected to face mask oxygen  Post-op Assessment: Report given to RN and Post -op Vital signs reviewed and stable  Post vital signs: Reviewed and stable  Last Vitals:  Filed Vitals:   06/13/15 0655  BP: 131/63  Pulse: 55  Temp: 36.6 C  Resp: 18    Complications: No apparent anesthesia complications

## 2015-06-13 NOTE — Discharge Instructions (Signed)
1.  Activity:  You are encouraged to ambulate frequently (about every hour during waking hours) to help prevent blood clots from forming in your legs or lungs.  However, you should not engage in any heavy lifting (> 10-15 lbs), strenuous activity, or straining. 2. Diet: You should advance your diet as instructed by your physician.  It will be normal to have some bloating, nausea, and abdominal discomfort intermittently. 3. Prescriptions:  You will be provided a prescription for pain medication to take as needed.  If your pain is not severe enough to require the prescription pain medication, you may take extra strength Tylenol instead which will have less side effects.  You should also take a prescribed stool softener to avoid straining with bowel movements as the prescription pain medication may constipate you. 4. Incisions: You may remove your dressing bandages 48 hours after surgery if not removed in the hospital.  You will either have some small staples or special tissue glue at each of the incision sites. Once the bandages are removed (if present), the incisions may stay open to air.  You may start showering (but not soaking or bathing in water) the 2nd day after surgery and the incisions simply need to be patted dry after the shower.  No additional care is needed. 5. What to call us about: You should call the office [phone]) if you develop fever > 101 or develop persistent vomiting.        You have a Foley catheter in place which drains urine out of your bladder. There are two parts: one part has a number and likely a colored band around it - this port should NOT be manipulated; the other piece connects to the drainage tubing and drainage bag. Before discharge from the hospital, your nurse will instruct you how to care for your foley catheter.   A foley catheter drains your bladder by gravity. The drainage bag should always be below the level of your bladder. If you are wearing a leg bag, it  should be below your waist, and at night, the bag should lay on the floor next to you in bed.   Sometimes, a piece of tissue or a blood clot can get caught in the foley catheter and cause it not to drain properly. In this case, you may leak urine around the catheter and you may feel like your bladder is full. In this case, you should disconnect the catheter from the drainage bag and flush the catheter with ~30cc of saline (available at [facility]). If this doesnt help, you should come in to be evaluated.  Other times, even though your foley catheter is draining well, you have the feeling that you have a full bladder, and you may leak around your catheter during painful bladder spasms. If this is the case, a medication called oxybutynin (or Ditropan) may help. It is normal to see some blood in your urine from time to time when you have a foley catheter. This can be due to irritation from the foley catheter inside the bladder. However, if your urine is the color of tomato juice with quarter-sized clots, this can clog the catheter, and you should call us for instructions. A physician will likely need to evaluate you.  If you are unable to get in touch with anyone and you feel it truly is an emergency, proceed to the nearest ER or call an ambulance.  Follow up as scheduled with Dr. Gaynelle Arabian

## 2015-06-13 NOTE — Anesthesia Preprocedure Evaluation (Signed)
Anesthesia Evaluation  Patient identified by MRN, date of birth, ID band Patient awake    Reviewed: Allergy & Precautions, NPO status , Patient's Chart, lab work & pertinent test results  Airway Mallampati: II  TM Distance: >3 FB Neck ROM: Full    Dental no notable dental hx. (+) Partial Lower, Missing   Pulmonary neg pulmonary ROS,  breath sounds clear to auscultation  Pulmonary exam normal       Cardiovascular hypertension, Pt. on medications Normal cardiovascular examRhythm:Regular Rate:Normal     Neuro/Psych negative neurological ROS  negative psych ROS   GI/Hepatic negative GI ROS, Neg liver ROS,   Endo/Other  negative endocrine ROS  Renal/GU negative Renal ROS  negative genitourinary   Musculoskeletal negative musculoskeletal ROS (+)   Abdominal   Peds negative pediatric ROS (+)  Hematology H/o ITP   Anesthesia Other Findings   Reproductive/Obstetrics negative OB ROS                             Anesthesia Physical Anesthesia Plan  ASA: II  Anesthesia Plan: General   Post-op Pain Management:    Induction: Intravenous  Airway Management Planned: Oral ETT  Additional Equipment:   Intra-op Plan:   Post-operative Plan: Extubation in OR  Informed Consent: I have reviewed the patients History and Physical, chart, labs and discussed the procedure including the risks, benefits and alternatives for the proposed anesthesia with the patient or authorized representative who has indicated his/her understanding and acceptance.   Dental advisory given  Plan Discussed with: CRNA  Anesthesia Plan Comments:         Anesthesia Quick Evaluation

## 2015-06-13 NOTE — Progress Notes (Signed)
UROLOGY PROGRESS NOTES  Assessment/Plan: Bradley Vasquez is a 81 y.o. male s/p suprapubic simple prostatectomy (transvesical) 06/13/15. Recovering well.  Neuro:  - Pain controlled on toradol and PO percocet as needed  CV:  - HDS  Pulmonary:  - Stable on 2LNC  FEN/GI:  - Diet: clears -MIVF @ 125 c/hr - Replace electrolytes as needed  GU: - Monitor urine output - 20 Fr foley draining cherry red urine without clot - JP with serosanguinous drainage - Foley catheter to remain x 2 weeks Heme/ID - Post-op Hb 12 from 13.5 pre-op - Ancef q8, will transition to bactrim once tolerating PO  Prophylaxis: - IS, OOB, SCDs, OOB, ambulate  Disposition: - Floor  Subjective: Doing well post-op. Pain controlled. Some nausea and small volume emesis after drinking ginger ale but responsive to Zofran. Expected mild drop in H/H, asymptomatic. Foley cherry red without clot. JP with 100 ss drainage, foley with 400 cc drainage.  Objective:  Vital signs in last 24 hours: Filed Vitals:   06/13/15 1345  BP: 126/62  Pulse: 63  Temp: 97.3 F (36.3 C)  Resp: 12     Intake/Output last 24 hours:   Total I/O In: 2500 [I.V.:2500] Out: 1150 [Urine:450; Drains:100; Blood:600]     Physical Exam: General: No acute distress Pulmonary: Normal work of breathing Cardiovascular: Pulse regular rate and rhythm Abdomen: Soft, nontender, nondistended, incision c/d/i, JP with serosang drainage GU; foley draining cherry red, thin, without clot Extremities: Warm and well perfused Neuro: Intact, no obvious deficits  Data Review: WBC      Lab Results  Component Value Date   WBC 5.7 06/10/2015   HGB 12.1* 06/13/2015   HCT 34.2* 06/13/2015   PLT 142* 06/10/2015     Lab Results  Component Value Date   NA 139 06/10/2015   K 4.2 06/10/2015   CL 104 06/10/2015   CO2 28 06/10/2015   BUN 17 06/10/2015   CREATININE 0.98 06/10/2015   CALCIUM 9.2 06/10/2015

## 2015-06-13 NOTE — Interval H&P Note (Signed)
History and Physical Interval Note:  06/13/2015 8:53 AM  Bradley Vasquez  has presented today for surgery, with the diagnosis of BPH      The various methods of treatment have been discussed with the patient and family. After consideration of risks, benefits and other options for treatment, the patient has consented to  Procedure(s):  RETROPUBIC PROSTATECTOMY  (N/A) as a surgical intervention .  The patient's history has been reviewed, patient examined, no change in status, stable for surgery.  I have reviewed the patient's chart and labs.  Questions were answered to the patient's satisfaction.     Taquisha Phung I Charlcie Prisco

## 2015-06-14 ENCOUNTER — Encounter (HOSPITAL_COMMUNITY): Payer: Self-pay | Admitting: Urology

## 2015-06-14 LAB — BASIC METABOLIC PANEL
ANION GAP: 2 — AB (ref 5–15)
BUN: 18 mg/dL (ref 6–20)
CALCIUM: 8.2 mg/dL — AB (ref 8.9–10.3)
CO2: 28 mmol/L (ref 22–32)
Chloride: 106 mmol/L (ref 101–111)
Creatinine, Ser: 1.18 mg/dL (ref 0.61–1.24)
GFR calc Af Amer: >60 mL/min (ref >60)
GFR calc non Af Amer: 56 mL/min — ABNORMAL LOW (ref >60)
Glucose, Bld: 141 mg/dL — ABNORMAL HIGH (ref 65–99)
Potassium: 4.5 mmol/L (ref 3.5–5.1)
Sodium: 136 mmol/L (ref 135–145)

## 2015-06-14 LAB — HEMOGLOBIN AND HEMATOCRIT, BLOOD
HEMATOCRIT: 31 % — AB (ref 39.0–52.0)
Hemoglobin: 10.9 g/dL — ABNORMAL LOW (ref 13.0–17.0)

## 2015-06-14 MED ORDER — TERBINAFINE HCL 1 % EX CREA: TOPICAL_CREAM | Freq: Every day | CUTANEOUS | Status: DC

## 2015-06-14 MED ORDER — BACITRACIN-NEOMYCIN-POLYMYXIN 400-5-5000 EX OINT: 1.0000 "application " | TOPICAL_OINTMENT | Freq: Three times a day (TID) | CUTANEOUS | Status: DC | PRN

## 2015-06-14 MED ORDER — TERBINAFINE HCL 1 % EX CREA
TOPICAL_CREAM | Freq: Every day | CUTANEOUS | Status: DC
Start: 2015-06-14 — End: 2015-06-16
  Administered 2015-06-14 – 2015-06-15 (×2): via TOPICAL
  Filled 2015-06-14: qty 30

## 2015-06-14 MED ORDER — MENTHOL 3 MG MT LOZG
1.0000 | LOZENGE | OROMUCOSAL | Status: DC | PRN
  Administered 2015-06-14: 3 mg via ORAL
  Filled 2015-06-14 (×2): qty 9

## 2015-06-14 MED ORDER — TERBINAFINE HCL 1 % EX CREA
TOPICAL_CREAM | Freq: Every day | CUTANEOUS | Status: DC
  Filled 2015-06-14: qty 12

## 2015-06-14 NOTE — Progress Notes (Signed)
UROLOGY PROGRESS NOTES  Assessment/Plan: Bradley Vasquez is a 81 y.o. male s/p suprapubic simple prostatectomy (transvesical) 06/13/15. Recovering well.  Neuro:  - Pain controlled on IV toradol and PO percocet as needed - Encourage PO analgesics  CV:  - HDS  Pulmonary:  - Stable on RA  FEN/GI:  - Diet: clears -MIVF @ 75 c/hr - Replace electrolytes as needed  GU: -Bump in Cr to 1.18, likely 2/2 toradol will continue to monitor - Monitor urine output - 20 Fr foley draining pink urine without clot - JP with serosanguinous drainage - Foley catheter to remain x 2 weeks  Heme/ID - Post-op Hb 12 from 13.5 pre-op now 10.9 (acute blood loss anemia, mild and expected, currently asymptomatic) - Ancef q8, will transition to bactrim once tolerating PO  Prophylaxis: - IS, OOB, SCDs, OOB, ambulate  Disposition: - Floor  Subjective: NAEON. Mild acute blood loss anemia without symptoms. Some nausea and emesis early after anesthesia but now tolerating PO without difficulty. Pain very well controlled on IV toradol. Excellent UOP with modest JP drainage and slight bump in serum Cr likely 2/2 AKI from toradol, will continue to monitor.  Objective:  Vital signs in last 24 hours: Filed Vitals:   06/14/15 0634  BP: 122/61  Pulse: 70  Temp: 98.5 F (36.9 C)  Resp: 18     Intake/Output last 24 hours: I/O last 3 completed shifts: In: 4827.9 [P.O.:480; I.V.:4247.9; IV Piggyback:100] Out: 2040 [Urine:1185; Drains:255; Blood:600]       Physical Exam: General: No acute distress Pulmonary: Normal work of breathing Cardiovascular: Pulse regular rate and rhythm Abdomen: Soft, nontender, nondistended, incision c/d/i, JP with serosang drainage GU; foley draining cherry red, thin, without clot Extremities: Warm and well perfused Neuro: Intact, no obvious deficits  Data Review: WBC      Lab Results  Component Value Date   WBC 5.7 06/10/2015   HGB 10.9* 06/14/2015   HCT 31.0*  06/14/2015   PLT 142* 06/10/2015     Lab Results  Component Value Date   NA 136 06/14/2015   K 4.5 06/14/2015   CL 106 06/14/2015   CO2 28 06/14/2015   BUN 18 06/14/2015   CREATININE 1.18 06/14/2015   CALCIUM 8.2* 06/14/2015

## 2015-06-14 NOTE — Progress Notes (Signed)
Utilization review completed. Vera Furniss, RN, BSN. 

## 2015-06-14 NOTE — Discharge Summary (Signed)
Date of admission: 06/13/2015  Date of discharge: 06/16/2015  Admission diagnosis:  BPH with obstruction and cystolithiasis Tinea Corporis  Discharge diagnosis: Same as above  Secondary diagnoses: Elevated PSA (POA) Nocturia (POA)   History and Physical: For full details, please see admission history and physical. Briefly, Luciano Cinquemani is a 81 y.o. year old patient with an enlarged prostate with associated bladder outlet obstruction and bladder stones.   Hospital Course:  Unremarkable.  Discharged on POD#3, having met discharge criteria.  JP removed prior to discharge.  BPH with obstruction and cystolithiasis-s/p open suprapubic transvesical simple prostatectomy 06/13/15. Transferred to the floor post-operatively. Diet slowly advanced, foley catheter in place draining cherry, clear urine without clots. Pain adequately controlled. Discharged home with foley in place on PO bactrim x 14 days (until catheter removed).  Tinea corporis- start terbinafine 1% topical x 1 week. Will readdress resolution versus need for ongoing/additional treatment at follow-up  Post-operative nausea- resolved POD 1 without significant intervention  Acute blood loss anemia- Post-op hemoglobin 12.1 from 13.5 pre-op, hemoglobins stabilized, remained asymptomatic. No need for transfusion.   Laboratory values:   Recent Labs  06/14/15 0434 06/15/15 0410 06/16/15 0415  HGB 10.9* 9.6* 10.0*  HCT 31.0* 26.9* 28.8*    Recent Labs  06/15/15 0410 06/16/15 0415  CREATININE 1.17 1.16    Disposition: Home  Discharge instruction: The patient was instructed to be ambulatory but told to refrain from heavy lifting, strenuous activity, or driving.   1.  Activity:  You are encouraged to ambulate frequently (about every hour during waking hours) to help prevent blood clots from forming in your legs or lungs.  However, you should not engage in any heavy lifting (> 10-15 lbs), strenuous activity, or  straining. 2. Diet: You should advance your diet as instructed by your physician.  It will be normal to have some bloating, nausea, and abdominal discomfort intermittently. 3. Prescriptions:  You will be provided a prescription for pain medication to take as needed.  If your pain is not severe enough to require the prescription pain medication, you may take extra strength Tylenol instead which will have less side effects.  You should also take a prescribed stool softener to avoid straining with bowel movements as the prescription pain medication may constipate you. 4. Incisions: You may remove your dressing bandages 48 hours after surgery if not removed in the hospital.  You will either have some small staples or special tissue glue at each of the incision sites. Once the bandages are removed (if present), the incisions may stay open to air.  You may start showering (but not soaking or bathing in water) the 2nd day after surgery and the incisions simply need to be patted dry after the shower.  No additional care is needed. 5. What to call us about: You should call the office [phone]) if you develop fever > 101 or develop persistent vomiting.   Discharge medications:    Medication List    STOP taking these medications        OVER THE COUNTER MEDICATION      TAKE these medications        amLODipine 10 MG tablet  Commonly known as:  NORVASC  Take 10 mg by mouth every morning.     docusate sodium 100 MG capsule  Commonly known as:  COLACE  Take 100 mg by mouth daily as needed for mild constipation.     docusate sodium 100 MG capsule  Commonly known as:  COLACE  Take 1 capsule (100 mg total) by mouth 2 (two) times daily.     Milk Thistle 200 MG Caps  Take 1 capsule by mouth daily.     multivitamin capsule  Take 1 capsule by mouth daily.     neomycin-bacitracin-polymyxin ointment  Commonly known as:  NEOSPORIN  Apply 1 application topically 3 (three) times daily as needed for  wound care (catheter irritation). apply to eye     oxybutynin 5 MG tablet  Commonly known as:  DITROPAN  Take 1 tablet (5 mg total) by mouth 3 (three) times daily.     oxyCODONE-acetaminophen 5-325 MG per tablet  Commonly known as:  ROXICET  Take 1 tablet by mouth every 4 (four) hours as needed for severe pain.     oxymetazoline 0.05 % nasal spray  Commonly known as:  AFRIN  Place 1 spray into both nostrils 2 (two) times daily as needed for congestion.     psyllium 95 % Pack  Commonly known as:  HYDROCIL/METAMUCIL  Take 1 packet by mouth daily.     senna 8.6 MG Tabs tablet  Commonly known as:  SENOKOT  Take 2 tablets (17.2 mg total) by mouth daily.     sulfamethoxazole-trimethoprim 800-160 MG per tablet  Commonly known as:  BACTRIM DS,SEPTRA DS  Take 1 tablet by mouth 2 (two) times daily.     terbinafine 1 % cream  Commonly known as:  LAMISIL  Apply topically daily.        Followup:  Follow-up Information    Follow up with Ailene Rud, MD.   Specialty:  Urology   Why:  as scheduled   Contact information:   [place] [address] [phone]

## 2015-06-15 LAB — BASIC METABOLIC PANEL
ANION GAP: 4 — AB (ref 5–15)
BUN: 14 mg/dL (ref 6–20)
CALCIUM: 8.5 mg/dL — AB (ref 8.9–10.3)
CHLORIDE: 108 mmol/L (ref 101–111)
CO2: 26 mmol/L (ref 22–32)
Creatinine, Ser: 1.17 mg/dL (ref 0.61–1.24)
GFR calc Af Amer: >60 mL/min (ref >60)
GFR, EST NON AFRICAN AMERICAN: 57 mL/min — AB (ref >60)
Glucose, Bld: 118 mg/dL — ABNORMAL HIGH (ref 65–99)
Potassium: 4.6 mmol/L (ref 3.5–5.1)
Sodium: 138 mmol/L (ref 135–145)

## 2015-06-15 LAB — CBC
HCT: 26.9 % — ABNORMAL LOW (ref 39.0–52.0)
Hemoglobin: 9.6 g/dL — ABNORMAL LOW (ref 13.0–17.0)
MCH: 33.2 pg (ref 26.0–34.0)
MCHC: 35.7 g/dL (ref 30.0–36.0)
MCV: 93.1 fL (ref 78.0–100.0)
PLATELETS: 110 10*3/uL — AB (ref 150–400)
RBC: 2.89 MIL/uL — ABNORMAL LOW (ref 4.22–5.81)
RDW: 13 % (ref 11.5–15.5)
WBC: 9.4 10*3/uL (ref 4.0–10.5)

## 2015-06-15 LAB — CREATININE, FLUID (PLEURAL, PERITONEAL, JP DRAINAGE): Creat, Fluid: 1.2 mg/dL

## 2015-06-15 MED ORDER — SULFAMETHOXAZOLE-TRIMETHOPRIM 800-160 MG PO TABS
1.0000 | ORAL_TABLET | Freq: Two times a day (BID) | ORAL | Status: DC
  Administered 2015-06-15 (×2): 1 via ORAL
  Filled 2015-06-15 (×3): qty 1

## 2015-06-15 NOTE — Progress Notes (Signed)
Urology Inpatient Progress Report BPH     06/13/2015  Open simple prostatectomy  Intv/Subj: No acute events overnight. Patient is without complaint.  Past Medical History  Diagnosis Date  . HTN (hypertension), benign   . Dilated intrahepatic bile duct   . Hyperbilirubinemia   . Hypertrophy of prostate with urinary obstruction   . Arthritis   . Cataract     bilateral cateracts removed  . 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 Hepatitis     hepatitis b years ago ywhen in [employer]   Current Facility-Administered Medications  Medication Dose Route Frequency Provider Last Rate Last Dose  . acetaminophen (TYLENOL) tablet 650 mg  650 mg Oral Q4H PRN Star Age, MD      . diphenhydrAMINE (BENADRYL) injection 12.5-25 mg  12.5-25 mg Intravenous Q6H PRN Star Age, MD       Or  . diphenhydrAMINE (BENADRYL) 12.5 MG/5ML elixir 12.5-25 mg  12.5-25 mg Oral Q6H PRN Star Age, MD      . docusate sodium (COLACE) capsule 100 mg  100 mg Oral BID Star Age, MD   100 mg at 06/14/15 2119  . menthol-cetylpyridinium (CEPACOL) lozenge 3 mg  1 lozenge Oral PRN Carolan Clines, MD   3 mg at 06/14/15 1139  . morphine 2 MG/ML injection 2-4 mg  2-4 mg Intravenous Q2H PRN Star Age, MD      . neomycin-bacitracin-polymyxin (NEOSPORIN) ointment 1 application  1 application Topical TID PRN Star Age, MD      . ondansetron [facility]) injection 4 mg  4 mg Intravenous Q4H PRN Star Age, MD   4 mg at 06/13/15 1815  . opium-belladonna (B&O SUPPRETTES) 16.2-60 MG suppository 1 suppository  1 suppository Rectal Q6H PRN Star Age, MD      . oxyCODONE-acetaminophen (PERCOCET/ROXICET) 5-325 MG per tablet 1-2 tablet  1-2 tablet Oral Q6H PRN Star Age, MD      . oxymetazoline (AFRIN) 0.05 % nasal spray 1 spray  1 spray Each Nare BID PRN Star Age, MD   1 spray at 06/13/15 2333  . psyllium (HYDROCIL/METAMUCIL) packet 1 packet  1 packet Oral Daily Star Age, MD   1 packet at 06/14/15 1022   . sulfamethoxazole-trimethoprim (BACTRIM DS,SEPTRA DS) 800-160 MG per tablet 1 tablet  1 tablet Oral Q12H Ardis Hughs, MD      . terbinafine (LAMISIL) 1 % cream   Topical Daily Carolan Clines, MD         Objective: Vital: Filed Vitals:   06/14/15 1110 06/14/15 1539 06/14/15 2011 06/15/15 0558  BP: 136/76 140/76 136/68 133/55  Pulse: 72 70 77 70  Temp: 97.9 F (36.6 C) 97.8 F (36.6 C) 99.6 F (37.6 C) 99 F (37.2 C)  TempSrc: Oral Oral Oral Oral  Resp: 18 18 18 18   Height:      Weight:      SpO2: 100% 100% 98% 95%   I/Os: I/O last 3 completed shifts: In: 4811.7 [P.O.:1320; I.V.:3291.7; IV Piggyback:200] Out: 5968 [Urine:5860; Drains:108]  Physical Exam:  General: Patient is in no apparent distress Lungs: Normal respiratory effort, chest expands symmetrically. GI: The abdomen is soft and nontender without mass. Urine straw colored Incision c/d/i JP drain serosang Ext: lower extremities symmetric  Lab Results:  Recent Labs  06/13/15 1318 06/14/15 0434 06/15/15 0410  WBC  --   --  9.4  HGB 12.1* 10.9* 9.6*  HCT 34.2* 31.0* 26.9*    Recent Labs  06/14/15 0434 06/15/15  0410  NA 136 138  K 4.5 4.6  CL 106 108  CO2 28 26  GLUCOSE 141* 118*  BUN 18 14  CREATININE 1.18 1.17  CALCIUM 8.2* 8.5*   No results for input(s): LABPT, INR in the last 72 hours. No results for input(s): LABURIN in the last 72 hours. No results found for this or any previous visit.  Studies/Results:   Assessment: 2 Days Post-Op  Doing well hgb drifting down (surgical blood loss + mild oozing from prostate bed)    Plan: Regular diet Recheck hgb in am HLIVF Transition to bactrim Oral pain meds Send JP for creatinine (hope to d/c tomorrow) Shooting for d/c tomorrow  Ardis Hughs 06/15/2015, 8:26 AM

## 2015-06-16 LAB — CBC
HCT: 28.8 % — ABNORMAL LOW (ref 39.0–52.0)
Hemoglobin: 10 g/dL — ABNORMAL LOW (ref 13.0–17.0)
MCH: 33.1 pg (ref 26.0–34.0)
MCHC: 34.7 g/dL (ref 30.0–36.0)
MCV: 95.4 fL (ref 78.0–100.0)
PLATELETS: 119 10*3/uL — AB (ref 150–400)
RBC: 3.02 MIL/uL — AB (ref 4.22–5.81)
RDW: 13.3 % (ref 11.5–15.5)
WBC: 6.7 10*3/uL (ref 4.0–10.5)

## 2015-06-16 LAB — BASIC METABOLIC PANEL
ANION GAP: 4 — AB (ref 5–15)
BUN: 14 mg/dL (ref 6–20)
CO2: 28 mmol/L (ref 22–32)
Calcium: 8.7 mg/dL — ABNORMAL LOW (ref 8.9–10.3)
Chloride: 105 mmol/L (ref 101–111)
Creatinine, Ser: 1.16 mg/dL (ref 0.61–1.24)
GFR calc Af Amer: >60 mL/min (ref >60)
GFR, EST NON AFRICAN AMERICAN: 58 mL/min — AB (ref >60)
GLUCOSE: 105 mg/dL — AB (ref 65–99)
POTASSIUM: 4.2 mmol/L (ref 3.5–5.1)
Sodium: 137 mmol/L (ref 135–145)

## 2015-06-16 NOTE — Progress Notes (Signed)
Urology Inpatient Progress Report BPH     06/13/2015  Open simple prostatectomy  Intv/Subj: No acute events overnight. Patient is without complaint.  Past Medical History  Diagnosis Date  . HTN (hypertension), benign   . Dilated intrahepatic bile duct   . Hyperbilirubinemia   . Hypertrophy of prostate with urinary obstruction   . Arthritis   . Cataract     bilateral cateracts removed  . 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 Hepatitis     hepatitis b years ago ywhen in [employer]   Current Facility-Administered Medications  Medication Dose Route Frequency Provider Last Rate Last Dose  . acetaminophen (TYLENOL) tablet 650 mg  650 mg Oral Q4H PRN Star Age, MD      . diphenhydrAMINE (BENADRYL) injection 12.5-25 mg  12.5-25 mg Intravenous Q6H PRN Star Age, MD       Or  . diphenhydrAMINE (BENADRYL) 12.5 MG/5ML elixir 12.5-25 mg  12.5-25 mg Oral Q6H PRN Star Age, MD      . docusate sodium (COLACE) capsule 100 mg  100 mg Oral BID Star Age, MD   100 mg at 06/15/15 2202  . menthol-cetylpyridinium (CEPACOL) lozenge 3 mg  1 lozenge Oral PRN Carolan Clines, MD   3 mg at 06/14/15 1139  . morphine 2 MG/ML injection 2-4 mg  2-4 mg Intravenous Q2H PRN Star Age, MD      . neomycin-bacitracin-polymyxin (NEOSPORIN) ointment 1 application  1 application Topical TID PRN Star Age, MD      . ondansetron [facility]) injection 4 mg  4 mg Intravenous Q4H PRN Star Age, MD   4 mg at 06/13/15 1815  . opium-belladonna (B&O SUPPRETTES) 16.2-60 MG suppository 1 suppository  1 suppository Rectal Q6H PRN Star Age, MD      . oxyCODONE-acetaminophen (PERCOCET/ROXICET) 5-325 MG per tablet 1-2 tablet  1-2 tablet Oral Q6H PRN Star Age, MD   1 tablet at 06/15/15 1626  . oxymetazoline (AFRIN) 0.05 % nasal spray 1 spray  1 spray Each Nare BID PRN Star Age, MD   1 spray at 06/13/15 2333  . psyllium (HYDROCIL/METAMUCIL) packet 1 packet  1 packet Oral Daily Star Age, MD   1  packet at 06/15/15 [phone]  . sulfamethoxazole-trimethoprim (BACTRIM DS,SEPTRA DS) 800-160 MG per tablet 1 tablet  1 tablet Oral Q12H Ardis Hughs, MD   1 tablet at 06/15/15 2202  . terbinafine (LAMISIL) 1 % cream   Topical Daily Carolan Clines, MD         Objective: Vital: Filed Vitals:   06/15/15 1008 06/15/15 1400 06/15/15 2108 06/16/15 0515  BP: 140/67 141/71 137/72 130/61  Pulse: 68 72 65 65  Temp: 98.4 F (36.9 C) 98.6 F (37 C) 98.1 F (36.7 C) 99 F (37.2 C)  TempSrc: Oral Oral Oral Oral  Resp: 18 18 18 18   Height:      Weight:      SpO2: 98% 98% 98% 97%   I/Os: I/O last 3 completed shifts: In: 1732.5 [P.O.:840; I.V.:842.5; IV Piggyback:50] Out: 4665 [LDJTT:0177; Drains:60]  Physical Exam:  General: Patient is in no apparent distress Lungs: Normal respiratory effort, chest expands symmetrically. GI: The abdomen is soft and nontender without mass. Urine straw colored Incision c/d/i JP drain serosang Ext: lower extremities symmetric  Lab Results:  Recent Labs  06/14/15 0434 06/15/15 0410 06/16/15 0415  WBC  --  9.4 6.7  HGB 10.9* 9.6* 10.0*  HCT 31.0* 26.9* 28.8*    Recent Labs  06/14/15 0434 06/15/15 0410 06/16/15 0415  NA 136 138 137  K 4.5 4.6 4.2  CL 106 108 105  CO2 28 26 28   GLUCOSE 141* 118* 105*  BUN 18 14 14   CREATININE 1.18 1.17 1.16  CALCIUM 8.2* 8.5* 8.7*   No results for input(s): LABPT, INR in the last 72 hours. No results for input(s): LABURIN in the last 72 hours. No results found for this or any previous visit.  Studies/Results:   Assessment: 3 Days Post-Op  Doing well hgb drifting stable.  Urine blood tinged.  JP creatinine consistent with serum - no leak    Plan: D/c JP D/c home with catheter. Bactrim x 2 weeks.  Louis Meckel W 06/16/2015, 8:06 AM

## 2015-06-19 LAB — STONE ANALYSIS
CA PHOS CRY STONE QL IR: 50 %
Ca Oxalate,Dihydrate: 40 %
Ca Oxalate,Monohydr.: 10 %
Stone Weight KSTONE: 180 mg

## 2016-06-14 IMAGING — MR MR ABDOMEN WO/W CM MRCP
11 of 21 series · 19 of 48 positions shown · IV contrast (multihance)
Comparison: CT of the abdomen 07/17/2014.

CLINICAL DATA: 81-year-old male with abnormal liver function tests.
Mild biliary tract dilatation noted on prior CT scan performed in
June 2014. Mild constipation and intermittent diarrhea.

EXAM:
MRI ABDOMEN WITHOUT AND WITH CONTRAST (INCLUDING MRCP)
TECHNIQUE: Multiplanar multisequence MR imaging of the abdomen was performed
both before and after the administration of intravenous contrast.
Heavily T2-weighted images of the biliary and pancreatic ducts were
obtained, and three-dimensional MRCP images were rendered by post
processing.
CONTRAST:  15mL MULTIHANCE GADOBENATE DIMEGLUMINE 529 MG/ML IV SOLN

[Series 3: T2 fat-sat · axial · 5.0mm · 0.78mm/px · 1 of 53 slices shown]
[im 1/53]
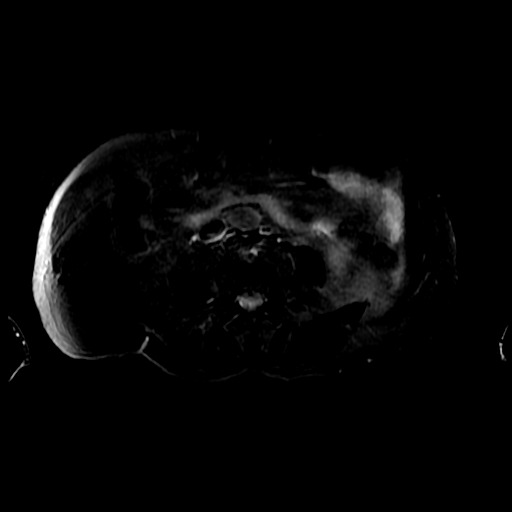

[Series 5: MRCP · coronal · 2.0mm · 0.70mm/px · 1 of 53 slices shown (1 of 2)]
[im 1/53]
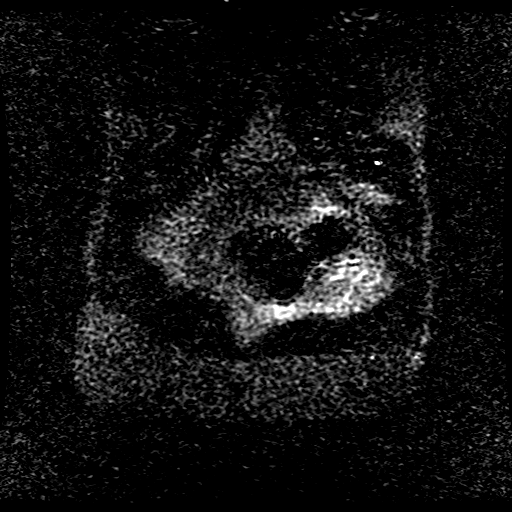

[Series 6: DWI b500 · axial · 6.0mm · 1.64mm/px · 1 of 66 slices shown]
[im 1/66]
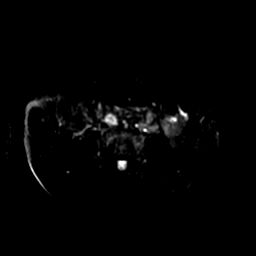

[Series 9: ax dualecho · axial · 5.0mm · 0.78mm/px · z∈[-210,+95]mm · 3 of 124 slices shown]
[im 1/124]
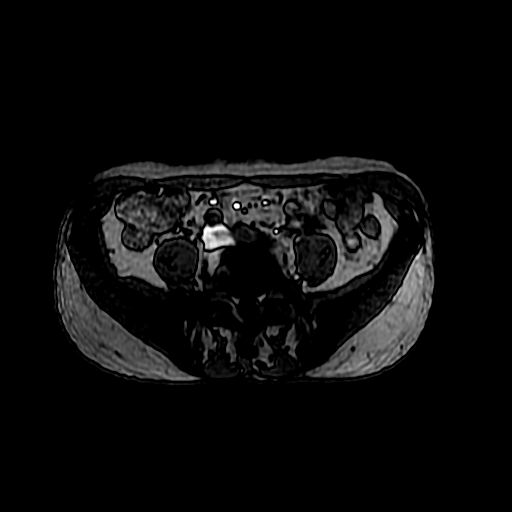
[im 62/124]
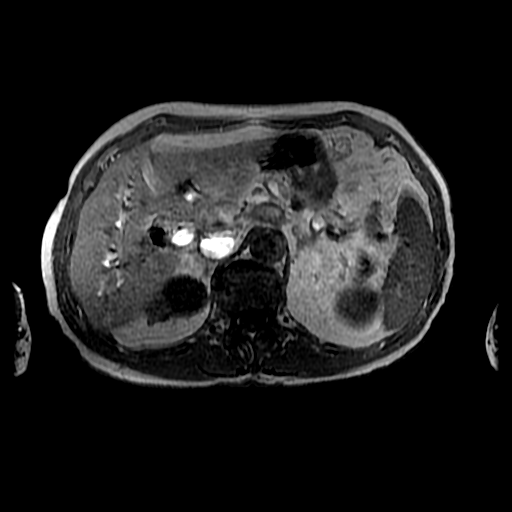
[im 124/124]
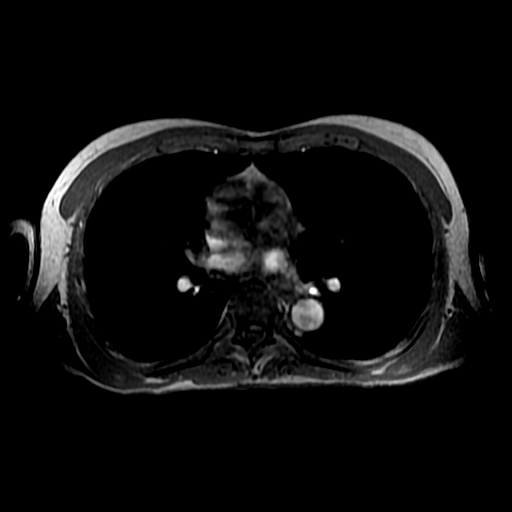

[Series 10: MRCP · coronal · 40.0mm · 0.70mm/px · 1 of 9 slices shown (2 of 2)]
[im 1/9]
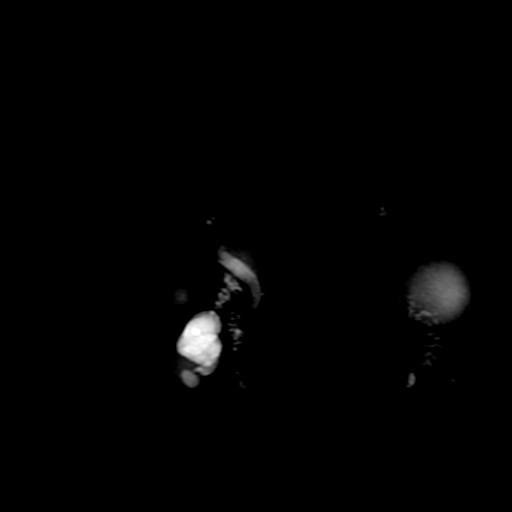

[Series 11: bSSFP fat-sat · coronal · 5.0mm · 0.78mm/px · 1 of 41 slices shown]
[im 1/41]
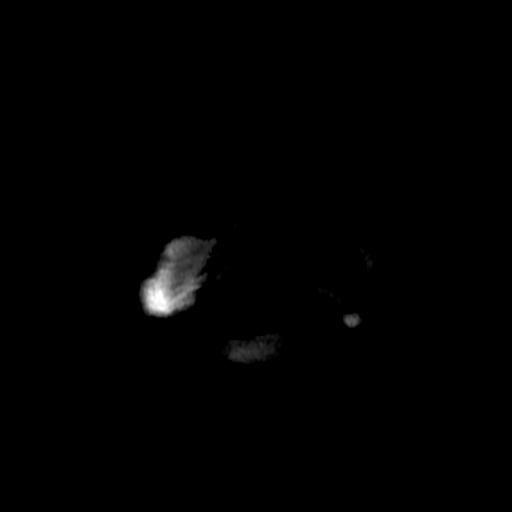

[Series 12: T2 · axial · 5.0mm · 0.78mm/px · 1 of 62 slices shown]
[im 1/62]
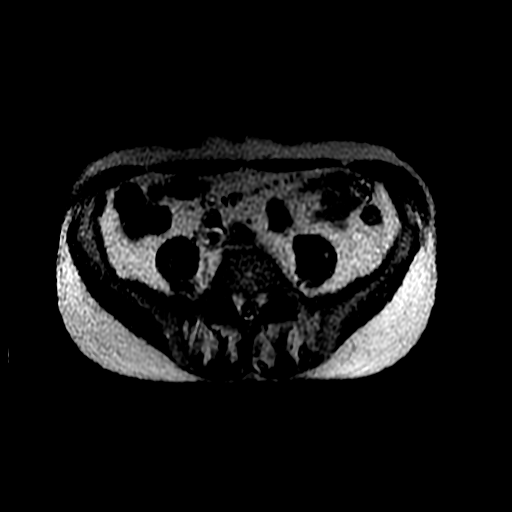

[Series 14: T1 dynamic · coronal · delayed · 7.0mm · 0.78mm/px · 2 of 64 slices shown (1 of 2)]
[im 1/64]
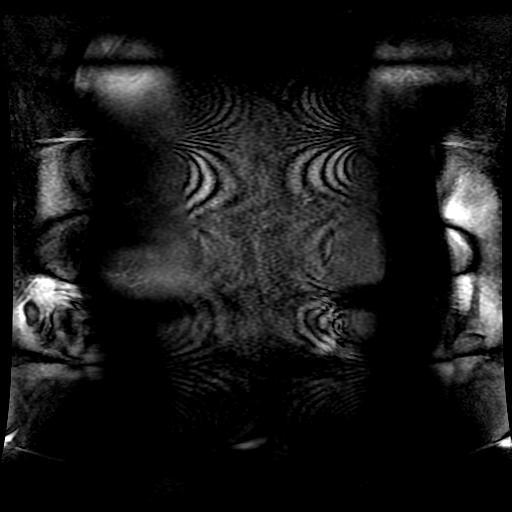
[im 64/64]
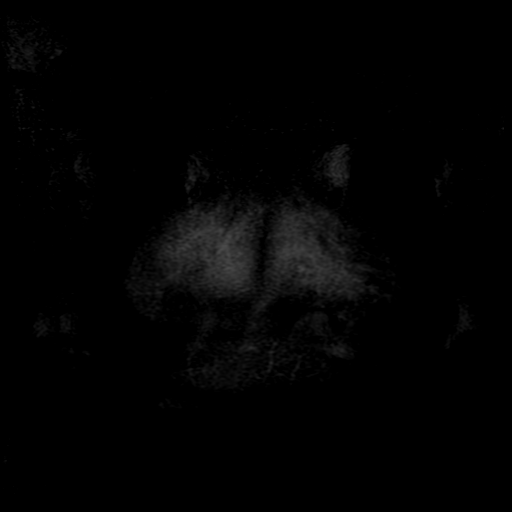

[Series 15: T1 dynamic · axial · 5.0mm · 0.78mm/px · z∈[-143,+75]mm · 3 of 88 slices shown (2 of 2)]
[im 1/88]
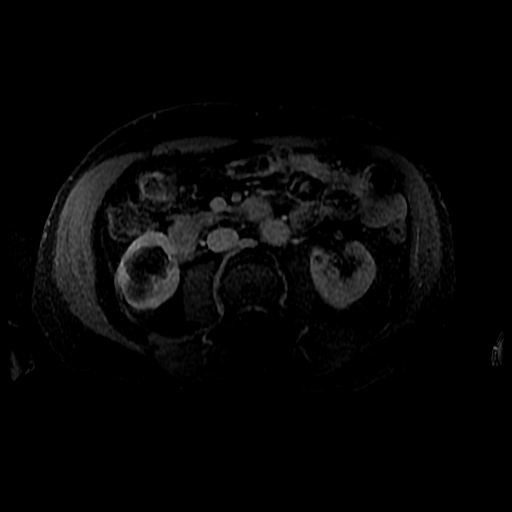
[im 44/88]
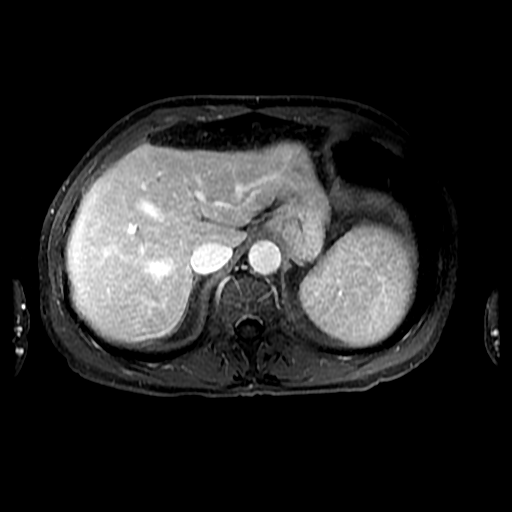
[im 88/88]
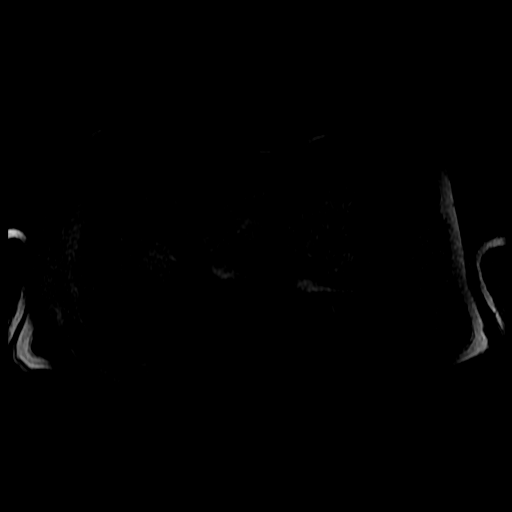

[Series 100: ((id)/(date)..88)-((id)/(id)/1..88) · axial · 5.0mm · 0.78mm/px · z∈[-143,+75]mm · 3 of 88 slices shown]
[im 1/88]
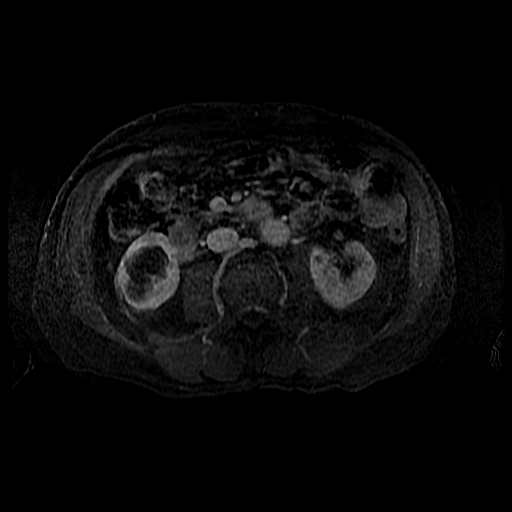
[im 44/88]
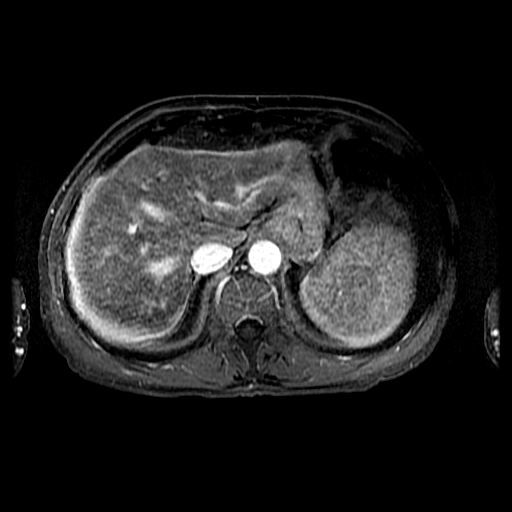
[im 88/88]
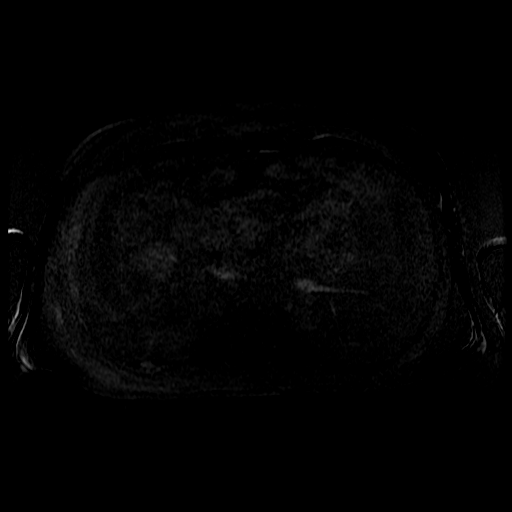

[Series 400: reformatted · axial · 1.6mm · 0.62mm/px · z∈[-79,+50]mm · 2 of 117 slices shown]
[im 1/117]
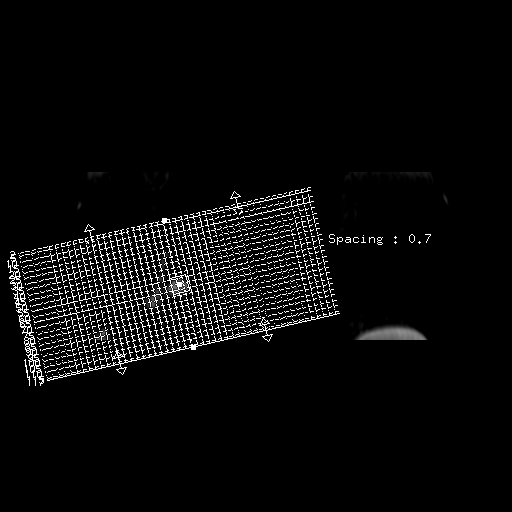
[im 59/117]
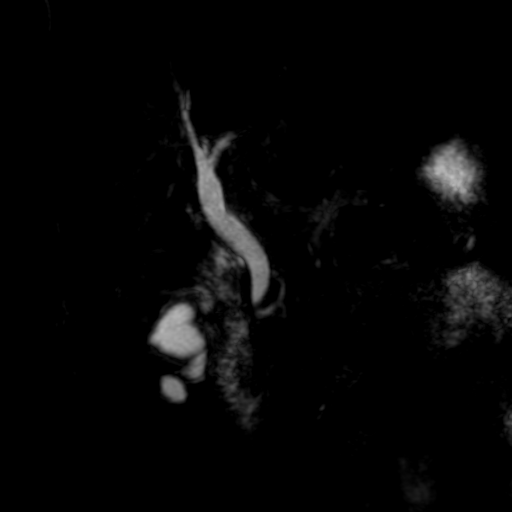

[19 of 48 positions shown; findings below may reference images not displayed]

FINDINGS: MRCP images demonstrate mild common bile duct dilatation measuring
13 mm in diameter. However, this smoothly tapers distally, and there
is no evidence of retained ductal stone, obstructing mass in the
region of the pancreatic head, or definite stricture. No associated
intrahepatic biliary ductal dilatation.

No focal cystic or solid hepatic lesions. No pancreatic lesions. No
pancreatic ductal dilatation. The appearance of the spleen is
normal. Bilateral adrenal glands are normal in appearance. Numerous
renal lesions bilaterally are low signal intensity on T1 weighted
images, high signal intensity on T2 weighted images, and do not
enhance, compatible with simple cysts, the largest of which measures
up to 6 cm in the upper pole of the left kidney. Aneurysmal
dilatation of the proximal celiac axis which measures up to 1.5 cm
in diameter.
IMPRESSION: 1. There is mild dilatation of the common bile duct. However, there
is no evidence to suggest ductal obstruction, and no significant
intrahepatic biliary ductal dilatation. These findings are favored
to represent "functional" biliary tract dilatation in an older post
cholecystectomy patient.
2. Multiple simple cysts in the kidneys bilaterally.

## 2017-05-11 DIAGNOSIS — R079 Chest pain, unspecified: Secondary | ICD-10-CM | POA: Diagnosis not present

## 2017-05-12 DIAGNOSIS — R079 Chest pain, unspecified: Secondary | ICD-10-CM | POA: Diagnosis not present

## 2017-12-06 ENCOUNTER — Ambulatory Visit: Payer: Self-pay | Admitting: Surgery

## 2017-12-06 NOTE — H&P (View-Only) (Signed)
Bradley Vasquez Documented: 12/06/2017 2:10 PM Location: [facility] Patient #: [id number] DOB: 10/26/1933 Married / Language: Bradley Vasquez / Race: White Male  History of Present Illness Bradley Vasquez [name] Bradley Vasquez; 12/06/2017 3:21 PM) Patient words: Patient returns for follow-up of lower abdominal incisional hernia. He was seen 3 years ago and found to have what appeared to be a lower abdominal wall hernia. He has a history of an open prostatectomy and bilateral inguinal hernia repair. He has significant suprapubic bulging in this area which is reducible. He opted for observation. He has a history of a urethral stricture that was dilated in [place] a year ago and he was followed here by urology. He denies any difficulty voiding at this point in time. Hernia is getting larger and he thinks he desires repair at this point she's quite active for his age. He denies significant abdominal pain nausea or vomiting.  The patient is a 84 year old male.   Allergies (Bradley Vasquez, Bradley Vasquez; 12/06/2017 2:12 PM) No Known Drug Allergies [11/04/2015]: Allergies Reconciled  Medication History (Bradley Vasquez, [place]; 12/06/2017 2:12 PM) Lisinopril-Hydrochlorothiazide (20-12.5MG  Tablet, Oral) Active. Aspirin (81MG  Tablet, Oral) Active. Norvasc (10MG  Tablet, Oral) Active. Milk Thistle (200MG  Capsule, Oral) Active. Multi Vitamin Daily (Oral) Active. Protonix (20MG  Tablet DR, Oral) Active. Medications Reconciled    Vitals (Bradley A. Bradley Vasquez Bradley Vasquez; 12/06/2017 2:12 PM) 12/06/2017 2:11 PM Weight: 176.8 lb Height: 73in Body Surface Area: 2.04 m Body Mass Index: 23.33 kg/m  Temp.: 98.97F  Pulse: 78 (Regular)  BP: 122/84 (Sitting, Left Arm, Standard)      Physical Exam (Bradley Vasquez A. Bradley Moure Bradley Vasquez; 12/06/2017 3:22 PM)  General Mental Status-Alert. General Appearance-Consistent with stated age. Hydration-Well hydrated. Voice-Normal.  Chest and Lung Exam Chest and lung exam reveals  -quiet, even and easy respiratory effort with no use of accessory muscles and on auscultation, normal breath sounds, no adventitious sounds and normal vocal resonance. Inspection Chest Wall - Normal. Back - normal.  Cardiovascular Cardiovascular examination reveals -normal heart sounds, regular rate and rhythm with no murmurs and normal pedal pulses bilaterally.  Abdomen Note: reducible bulge noted   Musculoskeletal Normal Exam - Left-Upper Extremity Strength Normal and Lower Extremity Strength Normal. Normal Exam - Right-Upper Extremity Strength Normal and Lower Extremity Strength Normal.    Assessment & Plan (Bradley Vasquez A. Bradley Sian Bradley Vasquez; 12/06/2017 2:41 PM)  INCISIONAL HERNIA, WITHOUT OBSTRUCTION OR GANGRENE (K43.2) Impression: pt desires repair recommend laparoscopic incisional hernia repair with mesh The risk of hernia repair include bleeding, infection, organ injury, bowel injury, bladder injury, nerve injury recurrent hernia, blood clots, worsening of underlying condition, chronic pain, mesh use, open surgery, death, and the need for other operattions. Pt agrees to proceed  Current Plans You are being scheduled for surgery- Our schedulers will call you.  You should hear from our office's scheduling department within 5 working days about the location, date, and time of surgery. We try to make accommodations for patient's preferences in scheduling surgery, but sometimes the OR schedule or the surgeon's schedule prevents [place] from making those accommodations.  If you have not heard from our office [phone]) in 5 working days, call the office and ask for your surgeon's nurse.  If you have other questions about your diagnosis, plan, or surgery, call the office and ask for your surgeon's nurse.  Pt Education - CCS Mesh education: discussed with patient and provided information. The anatomy & physiology of the abdominal wall was discussed. The pathophysiology of hernias was  discussed. Natural history risks  without surgery including progeressive enlargement, pain, incarceration, & strangulation was discussed. Contributors to complications such as smoking, obesity, diabetes, prior surgery, etc were discussed.  I feel the risks of no intervention will lead to serious problems that outweigh the operative risks; therefore, I recommended surgery to reduce and repair the hernia. I explained laparoscopic techniques with possible need for an open approach. I noted the probable use of mesh to patch and/or buttress the hernia repair  Risks such as bleeding, infection, abscess, need for further treatment, heart attack, death, and other risks were discussed. I noted a good likelihood this will help address the problem. Goals of post-operative recovery were discussed as well. Possibility that this will not correct all symptoms was explained. I stressed the importance of low-impact activity, aggressive pain control, avoiding constipation, & not pushing through pain to minimize risk of post-operative chronic pain or injury. Possibility of reherniation especially with smoking, obesity, diabetes, immunosuppression, and other health conditions was discussed. We will work to minimize complications.  An educational handout further explaining the pathology & treatment options was given as well. Questions were answered. The patient expresses understanding & wishes to proceed with surgery.  Pt Education - [facility] Laparoscopic Surgery HCI Pt Education - CCS Mesh education: discussed with patient and provided information.

## 2017-12-06 NOTE — H&P (Signed)
[name] Documented: 12/06/2017 2:10 PM Location: [facility] Patient #: [id number] DOB: [birth year] Married / Language: [name] / Race: White Male  History of Present Illness Marcello Moores A. Kolbie Lepkowski MD; 12/06/2017 3:21 PM) Patient words: Patient returns for follow-up of lower abdominal incisional hernia. He was seen 3 years ago and found to have what appeared to be a lower abdominal wall hernia. He has a history of an open prostatectomy and bilateral inguinal hernia repair. He has significant suprapubic bulging in this area which is reducible. He opted for observation. He has a history of a urethral stricture that was dilated in [place] a year ago and he was followed here by urology. He denies any difficulty voiding at this point in time. Hernia is getting larger and he thinks he desires repair at this point she's quite active for his age. He denies significant abdominal pain nausea or vomiting.  The patient is a 84 year old male.   Allergies ([name], [place]; 12/06/2017 2:12 PM) No Known Drug Allergies [11/04/2015]: Allergies Reconciled  Medication History ([name], [place]; 12/06/2017 2:12 PM) Lisinopril-Hydrochlorothiazide (20-12.5MG  Tablet, Oral) Active. Aspirin (81MG  Tablet, Oral) Active. Norvasc (10MG  Tablet, Oral) Active. Milk Thistle (200MG  Capsule, Oral) Active. Multi Vitamin Daily (Oral) Active. Protonix (20MG  Tablet DR, Oral) Active. Medications Reconciled    Vitals ([name]; 12/06/2017 2:12 PM) 12/06/2017 2:11 PM Weight: 176.8 lb Height: 73in Body Surface Area: 2.04 m Body Mass Index: 23.33 kg/m  Temp.: 98.5F  Pulse: 78 (Regular)  BP: 122/84 (Sitting, Left Arm, Standard)      Physical Exam (Derril Franek A. Josha Weekley MD; 12/06/2017 3:22 PM)  General Mental Status-Alert. General Appearance-Consistent with stated age. Hydration-Well hydrated. Voice-Normal.  Chest and Lung Exam Chest and lung exam reveals  -quiet, even and easy respiratory effort with no use of accessory muscles and on auscultation, normal breath sounds, no adventitious sounds and normal vocal resonance. Inspection Chest Wall - Normal. Back - normal.  Cardiovascular Cardiovascular examination reveals -normal heart sounds, regular rate and rhythm with no murmurs and normal pedal pulses bilaterally.  Abdomen Note: reducible bulge noted   Musculoskeletal Normal Exam - Left-Upper Extremity Strength Normal and Lower Extremity Strength Normal. Normal Exam - Right-Upper Extremity Strength Normal and Lower Extremity Strength Normal.    Assessment & Plan (Aeriana Speece A. Kadden Osterhout MD; 12/06/2017 2:41 PM)  INCISIONAL HERNIA, WITHOUT OBSTRUCTION OR GANGRENE (K43.2) Impression: pt desires repair recommend laparoscopic incisional hernia repair with mesh The risk of hernia repair include bleeding, infection, organ injury, bowel injury, bladder injury, nerve injury recurrent hernia, blood clots, worsening of underlying condition, chronic pain, mesh use, open surgery, death, and the need for other operattions. Pt agrees to proceed  Current Plans You are being scheduled for surgery- Our schedulers will call you.  You should hear from our office's scheduling department within 5 working days about the location, date, and time of surgery. We try to make accommodations for patient's preferences in scheduling surgery, but sometimes the OR schedule or the surgeon's schedule prevents [place] from making those accommodations.  If you have not heard from our office [phone]) in 5 working days, call the office and ask for your surgeon's nurse.  If you have other questions about your diagnosis, plan, or surgery, call the office and ask for your surgeon's nurse.  Pt Education - CCS Mesh education: discussed with patient and provided information. The anatomy & physiology of the abdominal wall was discussed. The pathophysiology of hernias was  discussed. Natural history risks  without surgery including progeressive enlargement, pain, incarceration, & strangulation was discussed. Contributors to complications such as smoking, obesity, diabetes, prior surgery, etc were discussed.  I feel the risks of no intervention will lead to serious problems that outweigh the operative risks; therefore, I recommended surgery to reduce and repair the hernia. I explained laparoscopic techniques with possible need for an open approach. I noted the probable use of mesh to patch and/or buttress the hernia repair  Risks such as bleeding, infection, abscess, need for further treatment, heart attack, death, and other risks were discussed. I noted a good likelihood this will help address the problem. Goals of post-operative recovery were discussed as well. Possibility that this will not correct all symptoms was explained. I stressed the importance of low-impact activity, aggressive pain control, avoiding constipation, & not pushing through pain to minimize risk of post-operative chronic pain or injury. Possibility of reherniation especially with smoking, obesity, diabetes, immunosuppression, and other health conditions was discussed. We will work to minimize complications.  An educational handout further explaining the pathology & treatment options was given as well. Questions were answered. The patient expresses understanding & wishes to proceed with surgery.  Pt Education - [facility] Laparoscopic Surgery HCI Pt Education - CCS Mesh education: discussed with patient and provided information.

## 2017-12-27 ENCOUNTER — Encounter (HOSPITAL_COMMUNITY): Payer: Self-pay

## 2017-12-27 NOTE — Pre-Procedure Instructions (Addendum)
Bradley Vasquez  12/27/2017      [id number] [place], [address] [place] [id number] Phone: [phone] Fax: [phone]    Your procedure is scheduled on 12/30/2017.  Report to [facility] Admitting at [facility].  Call this number if you have problems the morning of Vasquez:  [phone]   Remember:   Continue all medications as directed by your physician except follow these medication instructions before Vasquez below   Do not eat food or drink liquids after midnight except: Please complete your PRE-Vasquez ENSURE that was given to before you leave your house the morning of Vasquez.  Please, if able, drink it in one setting. DO NOT SIP.    Take these medicines the morning of Vasquez with A SIP OF WATER: Bradley (Norvasc)  7 days prior to Vasquez STOP taking any Aspirin (unless otherwise instructed by your surgeon), Aleve, Naproxen, Ibuprofen, Motrin, Advil, Goody's, BC's, all herbal medications, fish oil, and all vitamins    Do not wear jewelry.  Do not wear lotions, powders, or colognes, or deodorant.  Men may shave face and neck.  Do not bring valuables to the hospital.  [facility] is not responsible for any belongings or valuables.  Hearing aids, eyeglasses, contacts, dentures or bridgework may not be worn into Vasquez.  Leave your suitcase in the car.  After Vasquez it may be brought to your room.  For patients admitted to the hospital, discharge time will be determined by your treatment team.  Patients discharged the day of Vasquez will not be allowed to drive home.   Name and phone number of your driver:    Special instructions:   Bradley Vasquez  Before Vasquez, you can play an important role. Because skin is not sterile, your skin needs to be as free of germs as possible. You can reduce the number of germs on your skin by washing with CHG (chlorahexidine  gluconate) Soap before Vasquez.  CHG is an antiseptic cleaner which kills germs and bonds with the skin to continue killing germs even after washing.  Please do not use if you have an allergy to CHG or antibacterial soaps. If your skin becomes reddened/irritated stop using the CHG.  Do not shave (including legs and underarms) for at least 48 hours prior to first CHG shower. It is OK to shave your face.  Please follow these instructions carefully.   1. Shower the NIGHT BEFORE Vasquez and the MORNING OF Vasquez with CHG.   2. If you chose to wash your hair, wash your hair first as usual with your normal shampoo.  3. After you shampoo, rinse your hair and body thoroughly to remove the shampoo.  4. Use CHG as you would any other liquid soap. You can apply CHG directly to the skin and wash gently with a scrungie or a clean washcloth.   5. Apply the CHG Soap to your body ONLY FROM THE NECK DOWN.  Do not use on open wounds or open sores. Avoid contact with your eyes, ears, mouth and genitals (private parts). Wash Face and genitals (private parts)  with your normal soap.  6. Wash thoroughly, paying special attention to the area where your Vasquez will be performed.  7. Thoroughly rinse your body with warm water from the neck down.  8. DO NOT shower/wash with your normal soap after using and rinsing off the CHG Soap.  9.  Pat yourself dry with a CLEAN TOWEL.  10. Wear CLEAN PAJAMAS to bed the night before Vasquez, wear comfortable clothes the morning of Vasquez  11. Place CLEAN SHEETS on your bed the night of your first shower and DO NOT SLEEP WITH PETS.    Day of Vasquez: Shower as stated above. Do not apply any deodorants/lotions. Please wear clean clothes to the hospital/Vasquez [facility].      Please read over the following fact sheets that you were given.

## 2017-12-28 ENCOUNTER — Encounter (HOSPITAL_COMMUNITY)
Admission: RE | Admit: 2017-12-28 | Discharge: 2017-12-28 | Disposition: A | Discharge status: Home / Self Care | Payer: Medicare Other | Source: Ambulatory Visit | Attending: Surgery | Admitting: Surgery

## 2017-12-28 ENCOUNTER — Other Ambulatory Visit: Payer: Self-pay

## 2017-12-28 ENCOUNTER — Encounter (HOSPITAL_COMMUNITY): Payer: Self-pay

## 2017-12-28 DIAGNOSIS — K432 Incisional hernia without obstruction or gangrene: Secondary | ICD-10-CM | POA: Diagnosis not present

## 2017-12-28 DIAGNOSIS — I10 Essential (primary) hypertension: Secondary | ICD-10-CM | POA: Diagnosis not present

## 2017-12-28 DIAGNOSIS — Z79899 Other long term (current) drug therapy: Secondary | ICD-10-CM | POA: Diagnosis not present

## 2017-12-28 LAB — CBC WITH DIFFERENTIAL/PLATELET
BASOS PCT: 1 %
Basophils Absolute: 0 10*3/uL (ref 0.0–0.1)
EOS PCT: 1 %
Eosinophils Absolute: 0.1 10*3/uL (ref 0.0–0.7)
HEMATOCRIT: 38.4 % — AB (ref 39.0–52.0)
Hemoglobin: 13.6 g/dL (ref 13.0–17.0)
Lymphocytes Relative: 19 %
Lymphs Abs: 1.3 10*3/uL (ref 0.7–4.0)
MCH: 33.3 pg (ref 26.0–34.0)
MCHC: 35.4 g/dL (ref 30.0–36.0)
MCV: 94.1 fL (ref 78.0–100.0)
MONOS PCT: 9 %
Monocytes Absolute: 0.6 10*3/uL (ref 0.1–1.0)
NEUTROS ABS: 5 10*3/uL (ref 1.7–7.7)
Neutrophils Relative %: 70 %
PLATELETS: 214 10*3/uL (ref 150–400)
RBC: 4.08 MIL/uL — ABNORMAL LOW (ref 4.22–5.81)
RDW: 13.3 % (ref 11.5–15.5)
WBC: 7 10*3/uL (ref 4.0–10.5)

## 2017-12-28 LAB — COMPREHENSIVE METABOLIC PANEL
ALBUMIN: 4.1 g/dL (ref 3.5–5.0)
ALT: 16 U/L — ABNORMAL LOW (ref 17–63)
AST: 24 U/L (ref 15–41)
Alkaline Phosphatase: 56 U/L (ref 38–126)
Anion gap: 8 (ref 5–15)
BILIRUBIN TOTAL: 1.9 mg/dL — AB (ref 0.3–1.2)
BUN: 14 mg/dL (ref 6–20)
CALCIUM: 9.8 mg/dL (ref 8.9–10.3)
CO2: 26 mmol/L (ref 22–32)
CREATININE: 1.14 mg/dL (ref 0.61–1.24)
Chloride: 101 mmol/L (ref 101–111)
GFR calc Af Amer: >60 mL/min (ref >60)
GFR calc non Af Amer: 58 mL/min — ABNORMAL LOW (ref >60)
GLUCOSE: 94 mg/dL (ref 65–99)
Potassium: 5 mmol/L (ref 3.5–5.1)
Sodium: 135 mmol/L (ref 135–145)
Total Protein: 6.4 g/dL — ABNORMAL LOW (ref 6.5–8.1)

## 2017-12-28 NOTE — Progress Notes (Signed)
PCP - Dr. Nyra Capes in [place], [place] Cardiologist - patient cannot recall name, only saw once for a stress test that was several years ago and normal.  Chest x-ray - n/a EKG - 12/28/2017 Stress Test - 7-10 years ago but cannot recall name of cardiologist he saw; pat states it was normal ECHO - patient denies Cardiac Cath - patient denies  Sleep Study - patient denies  Anesthesia review:   Patient denies shortness of breath, fever, cough and chest pain at PAT appointment   Patient verbalized understanding of instructions that were given to them at the PAT appointment. Patient was also instructed that they will need to review over the PAT instructions again at home before surgery.

## 2017-12-29 MED ORDER — CEFAZOLIN SODIUM-DEXTROSE 2-4 GM/100ML-% IV SOLN
2.0000 g | INTRAVENOUS | Status: AC
  Administered 2017-12-30: 2 g via INTRAVENOUS
  Filled 2017-12-29: qty 100

## 2017-12-30 ENCOUNTER — Ambulatory Visit (HOSPITAL_COMMUNITY): Payer: Medicare Other

## 2017-12-30 ENCOUNTER — Encounter (HOSPITAL_COMMUNITY): Payer: Self-pay | Admitting: *Deleted

## 2017-12-30 ENCOUNTER — Encounter (HOSPITAL_COMMUNITY)
Admission: RE | Disposition: A | Discharge status: Home / Self Care | Payer: Self-pay | Source: Ambulatory Visit | Attending: Surgery

## 2017-12-30 ENCOUNTER — Other Ambulatory Visit: Payer: Self-pay

## 2017-12-30 ENCOUNTER — Ambulatory Visit (HOSPITAL_COMMUNITY)
Admission: RE | Admit: 2017-12-30 | Discharge: 2017-12-30 | Disposition: A | Discharge status: Home / Self Care | Payer: Medicare Other | Source: Ambulatory Visit | Attending: Surgery | Admitting: Surgery

## 2017-12-30 DIAGNOSIS — Z79899 Other long term (current) drug therapy: Secondary | ICD-10-CM | POA: Insufficient documentation

## 2017-12-30 DIAGNOSIS — K432 Incisional hernia without obstruction or gangrene: Secondary | ICD-10-CM | POA: Insufficient documentation

## 2017-12-30 DIAGNOSIS — I10 Essential (primary) hypertension: Secondary | ICD-10-CM | POA: Diagnosis not present

## 2017-12-30 HISTORY — PX: INCISIONAL HERNIA REPAIR: SHX193

## 2017-12-30 HISTORY — PX: INSERTION OF MESH: SHX5868

## 2017-12-30 SURGERY — REPAIR, HERNIA, INCISIONAL, LAPAROSCOPIC
Anesthesia: General

## 2017-12-30 MED ORDER — FENTANYL CITRATE (PF) 250 MCG/5ML IJ SOLN
INTRAMUSCULAR | Status: AC
  Filled 2017-12-30: qty 5

## 2017-12-30 MED ORDER — BUPIVACAINE-EPINEPHRINE (PF) 0.5% -1:200000 IJ SOLN
INTRAMUSCULAR | Status: AC
  Filled 2017-12-30: qty 30

## 2017-12-30 MED ORDER — LACTATED RINGERS IV SOLN
INTRAVENOUS | Status: DC
  Administered 2017-12-30 (×2): via INTRAVENOUS

## 2017-12-30 MED ORDER — ROCURONIUM BROMIDE 10 MG/ML (PF) SYRINGE
PREFILLED_SYRINGE | INTRAVENOUS | Status: DC | PRN
  Administered 2017-12-30: 60 mg via INTRAVENOUS

## 2017-12-30 MED ORDER — ONDANSETRON HCL 4 MG/2ML IJ SOLN
INTRAMUSCULAR | Status: DC | PRN
  Administered 2017-12-30: 4 mg via INTRAVENOUS

## 2017-12-30 MED ORDER — ACETAMINOPHEN 500 MG PO TABS
ORAL_TABLET | ORAL | Status: AC
  Administered 2017-12-30: 1000 mg via ORAL
  Filled 2017-12-30: qty 2

## 2017-12-30 MED ORDER — ESMOLOL HCL 100 MG/10ML IV SOLN
INTRAVENOUS | Status: AC
Start: 2017-12-30 — End: 2017-12-30
  Filled 2017-12-30: qty 10

## 2017-12-30 MED ORDER — LIDOCAINE 2% (20 MG/ML) 5 ML SYRINGE
INTRAMUSCULAR | Status: DC | PRN
  Administered 2017-12-30: 80 mg via INTRAVENOUS

## 2017-12-30 MED ORDER — LACTATED RINGERS IV SOLN: INTRAVENOUS | Status: DC

## 2017-12-30 MED ORDER — PHENYLEPHRINE HCL 10 MG/ML IJ SOLN
INTRAMUSCULAR | Status: AC
  Filled 2017-12-30: qty 1

## 2017-12-30 MED ORDER — BUPIVACAINE-EPINEPHRINE 0.5% -1:200000 IJ SOLN
INTRAMUSCULAR | Status: DC | PRN
  Administered 2017-12-30: 10 mL

## 2017-12-30 MED ORDER — ROCURONIUM BROMIDE 10 MG/ML (PF) SYRINGE
PREFILLED_SYRINGE | INTRAVENOUS | Status: AC
  Filled 2017-12-30: qty 5

## 2017-12-30 MED ORDER — GABAPENTIN 300 MG PO CAPS
300.0000 mg | ORAL_CAPSULE | ORAL | Status: AC
  Administered 2017-12-30: 300 mg via ORAL

## 2017-12-30 MED ORDER — FENTANYL CITRATE (PF) 100 MCG/2ML IJ SOLN
INTRAMUSCULAR | Status: DC | PRN
  Administered 2017-12-30: 50 ug via INTRAVENOUS
  Administered 2017-12-30: 100 ug via INTRAVENOUS

## 2017-12-30 MED ORDER — OXYCODONE HCL 5 MG PO TABS: 5.0000 mg | ORAL_TABLET | Freq: Four times a day (QID) | ORAL | 0 refills | Status: DC | PRN

## 2017-12-30 MED ORDER — SUGAMMADEX SODIUM 200 MG/2ML IV SOLN
INTRAVENOUS | Status: DC | PRN
  Administered 2017-12-30: 160 mg via INTRAVENOUS

## 2017-12-30 MED ORDER — CHLORHEXIDINE GLUCONATE CLOTH 2 % EX PADS: 6.0000 | MEDICATED_PAD | Freq: Once | CUTANEOUS | Status: DC

## 2017-12-30 MED ORDER — ONDANSETRON HCL 4 MG/2ML IJ SOLN
INTRAMUSCULAR | Status: AC
  Filled 2017-12-30: qty 2

## 2017-12-30 MED ORDER — SUGAMMADEX SODIUM 200 MG/2ML IV SOLN
INTRAVENOUS | Status: AC
  Filled 2017-12-30: qty 2

## 2017-12-30 MED ORDER — DEXAMETHASONE SODIUM PHOSPHATE 10 MG/ML IJ SOLN
INTRAMUSCULAR | Status: AC
  Filled 2017-12-30: qty 1

## 2017-12-30 MED ORDER — GABAPENTIN 300 MG PO CAPS
ORAL_CAPSULE | ORAL | Status: AC
  Administered 2017-12-30: 300 mg via ORAL
  Filled 2017-12-30: qty 1

## 2017-12-30 MED ORDER — IBUPROFEN 800 MG PO TABS: 800.0000 mg | ORAL_TABLET | Freq: Three times a day (TID) | ORAL | 0 refills | Status: DC | PRN

## 2017-12-30 MED ORDER — ACETAMINOPHEN 500 MG PO TABS
1000.0000 mg | ORAL_TABLET | ORAL | Status: AC
  Administered 2017-12-30: 1000 mg via ORAL

## 2017-12-30 MED ORDER — FENTANYL CITRATE (PF) 100 MCG/2ML IJ SOLN: 25.0000 ug | INTRAMUSCULAR | Status: DC | PRN

## 2017-12-30 MED ORDER — DEXAMETHASONE SODIUM PHOSPHATE 10 MG/ML IJ SOLN
INTRAMUSCULAR | Status: DC | PRN
  Administered 2017-12-30: 10 mg via INTRAVENOUS

## 2017-12-30 MED ORDER — PHENYLEPHRINE HCL 10 MG/ML IJ SOLN
INTRAVENOUS | Status: DC | PRN
  Administered 2017-12-30: 20 ug/min via INTRAVENOUS

## 2017-12-30 MED ORDER — 0.9 % SODIUM CHLORIDE (POUR BTL) OPTIME
TOPICAL | Status: DC | PRN
  Administered 2017-12-30: 1000 mL

## 2017-12-30 MED ORDER — PROPOFOL 10 MG/ML IV BOLUS
INTRAVENOUS | Status: AC
  Filled 2017-12-30: qty 20

## 2017-12-30 MED ORDER — PROPOFOL 10 MG/ML IV BOLUS
INTRAVENOUS | Status: DC | PRN
  Administered 2017-12-30: 140 mg via INTRAVENOUS

## 2017-12-30 SURGICAL SUPPLY — 67 items
ADH SKN CLS APL DERMABOND .7 (GAUZE/BANDAGES/DRESSINGS) ×1
APPLIER CLIP ROT 10 11.4 M/L (STAPLE)
APR CLP MED LRG 11.4X10 (STAPLE)
BINDER ABDOMINAL 12 ML 46-62 (SOFTGOODS) ×2 IMPLANT
BLADE CLIPPER SURG (BLADE) ×2 IMPLANT
CANISTER SUCT 3000ML PPV (MISCELLANEOUS) ×3 IMPLANT
CHLORAPREP W/TINT 26ML (MISCELLANEOUS) ×3 IMPLANT
CLIP APPLIE ROT 10 11.4 M/L (STAPLE) IMPLANT
COVER SURGICAL LIGHT HANDLE (MISCELLANEOUS) ×3 IMPLANT
DERMABOND ADVANCED (GAUZE/BANDAGES/DRESSINGS) ×2
DERMABOND ADVANCED .7 DNX12 (GAUZE/BANDAGES/DRESSINGS) ×1 IMPLANT
DEVICE RELIATACK FIXATION (MISCELLANEOUS) ×1 IMPLANT
DEVICE TROCAR PUNCTURE CLOSURE (ENDOMECHANICALS) ×3 IMPLANT
DRAPE WARM FLUID 44X44 (DRAPE) ×3 IMPLANT
DRSG PAD ABDOMINAL 8X10 ST (GAUZE/BANDAGES/DRESSINGS) ×2 IMPLANT
ELECT CAUTERY BLADE 6.4 (BLADE) ×3 IMPLANT
ELECT REM PT RETURN 9FT ADLT (ELECTROSURGICAL) ×3
ELECTRODE REM PT RTRN 9FT ADLT (ELECTROSURGICAL) ×1 IMPLANT
GLOVE BIO SURGEON STRL SZ8 (GLOVE) ×3 IMPLANT
GLOVE BIOGEL PI IND STRL 6 (GLOVE) IMPLANT
GLOVE BIOGEL PI IND STRL 6.5 (GLOVE) IMPLANT
GLOVE BIOGEL PI IND STRL 7.0 (GLOVE) IMPLANT
GLOVE BIOGEL PI IND STRL 7.5 (GLOVE) IMPLANT
GLOVE BIOGEL PI IND STRL 8 (GLOVE) ×1 IMPLANT
GLOVE BIOGEL PI INDICATOR 6 (GLOVE) ×2
GLOVE BIOGEL PI INDICATOR 6.5 (GLOVE) ×2
GLOVE BIOGEL PI INDICATOR 7.0 (GLOVE) ×2
GLOVE BIOGEL PI INDICATOR 7.5 (GLOVE) ×2
GLOVE BIOGEL PI INDICATOR 8 (GLOVE) ×4
GLOVE ECLIPSE 7.0 STRL STRAW (GLOVE) ×2 IMPLANT
GLOVE ECLIPSE 8.0 STRL XLNG CF (GLOVE) ×2 IMPLANT
GOWN STRL REUS W/ TWL LRG LVL3 (GOWN DISPOSABLE) ×2 IMPLANT
GOWN STRL REUS W/ TWL XL LVL3 (GOWN DISPOSABLE) ×1 IMPLANT
GOWN STRL REUS W/TWL LRG LVL3 (GOWN DISPOSABLE) ×6
GOWN STRL REUS W/TWL XL LVL3 (GOWN DISPOSABLE) ×3
KIT BASIN OR (CUSTOM PROCEDURE TRAY) ×3 IMPLANT
KIT ROOM TURNOVER OR (KITS) ×3 IMPLANT
MARKER SKIN DUAL TIP RULER LAB (MISCELLANEOUS) ×3 IMPLANT
MESH VENTRALEX ST 8CM LRG (Mesh General) ×2 IMPLANT
NS IRRIG 1000ML POUR BTL (IV SOLUTION) ×3 IMPLANT
PAD ARMBOARD 7.5X6 YLW CONV (MISCELLANEOUS) ×6 IMPLANT
PENCIL BUTTON HOLSTER BLD 10FT (ELECTRODE) ×5 IMPLANT
RELOAD ENDO RELIATCK 10 HERNIA (MISCELLANEOUS) IMPLANT
RELOAD ENDO RELIATCK 5 HERNIA (MISCELLANEOUS) IMPLANT
RELOAD RELIATACK 10 (MISCELLANEOUS) IMPLANT
RELOAD RELIATACK 5 (MISCELLANEOUS) IMPLANT
SCISSORS LAP 5X35 DISP (ENDOMECHANICALS) ×3 IMPLANT
SET IRRIG TUBING LAPAROSCOPIC (IRRIGATION / IRRIGATOR) IMPLANT
SHEARS HARMONIC ACE PLUS 36CM (ENDOMECHANICALS) IMPLANT
SLEEVE ENDOPATH XCEL 5M (ENDOMECHANICALS) ×5 IMPLANT
SUT MON AB 4-0 PC3 18 (SUTURE) ×3 IMPLANT
SUT NOVA NAB DX-16 0-1 5-0 T12 (SUTURE) ×3 IMPLANT
SUT VIC AB 0 CT1 27 (SUTURE)
SUT VIC AB 0 CT1 27XBRD ANBCTR (SUTURE) IMPLANT
SUT VIC AB 3-0 SH 27 (SUTURE) ×3
SUT VIC AB 3-0 SH 27X BRD (SUTURE) IMPLANT
TOWEL OR 17X24 6PK STRL BLUE (TOWEL DISPOSABLE) ×3 IMPLANT
TOWEL OR 17X26 10 PK STRL BLUE (TOWEL DISPOSABLE) ×3 IMPLANT
TRAY FOLEY CATH SILVER 16FR (SET/KITS/TRAYS/PACK) ×2 IMPLANT
TRAY LAPAROSCOPIC MC (CUSTOM PROCEDURE TRAY) ×3 IMPLANT
TROCAR XCEL NON-BLD 11X100MML (ENDOMECHANICALS) ×3 IMPLANT
TROCAR XCEL NON-BLD 5MMX100MML (ENDOMECHANICALS) ×3 IMPLANT
TUBE CONNECTING 12'X1/4 (SUCTIONS) ×1
TUBE CONNECTING 12X1/4 (SUCTIONS) ×2 IMPLANT
TUBING INSUFFLATION (TUBING) ×3 IMPLANT
WATER STERILE IRR 1000ML POUR (IV SOLUTION) ×3 IMPLANT
YANKAUER SUCT BULB TIP NO VENT (SUCTIONS) ×5 IMPLANT

## 2017-12-30 NOTE — Anesthesia Procedure Notes (Signed)
Procedure Name: Intubation Date/Time: 12/30/2017 2:23 PM Performed by: Harden Mo, CRNA Pre-anesthesia Checklist: Patient identified, Emergency Drugs available, Suction available and Patient being monitored Patient Re-evaluated:Patient Re-evaluated prior to induction Oxygen Delivery Method: Circle System Utilized Preoxygenation: Pre-oxygenation with 100% oxygen Induction Type: IV induction Ventilation: Mask ventilation without difficulty and Oral airway inserted - appropriate to patient size Laryngoscope Size: Sabra Heck and 2 Grade View: Grade I Tube type: Oral Tube size: 7.5 mm Number of attempts: 1 Airway Equipment and Method: Stylet and Oral airway Placement Confirmation: ETT inserted through vocal cords under direct vision,  positive ETCO2 and breath sounds checked- equal and bilateral Secured at: 23 cm Tube secured with: Tape Dental Injury: Teeth and Oropharynx as per pre-operative assessment

## 2017-12-30 NOTE — Anesthesia Preprocedure Evaluation (Addendum)
Anesthesia Evaluation  Patient identified by MRN, date of birth, ID band Patient awake    Reviewed: Allergy & Precautions, H&P , Patient's Chart, lab work & pertinent test results, reviewed documented beta blocker date and time   Airway Mallampati: II  TM Distance: >3 FB Neck ROM: full    Dental no notable dental hx. (+) Partial Upper, Dental Advisory Given   Pulmonary    Pulmonary exam normal breath sounds clear to auscultation       Cardiovascular hypertension,  Rhythm:regular Rate:Normal     Neuro/Psych    GI/Hepatic   Endo/Other    Renal/GU      Musculoskeletal   Abdominal   Peds  Hematology   Anesthesia Other Findings   Reproductive/Obstetrics                            Anesthesia Physical Anesthesia Plan  ASA: II  Anesthesia Plan: General   Post-op Pain Management:    Induction: Intravenous  PONV Risk Score and Plan: 2 and Treatment may vary due to age or medical condition, Dexamethasone and Ondansetron  Airway Management Planned: Oral ETT  Additional Equipment:   Intra-op Plan:   Post-operative Plan: Extubation in OR  Informed Consent: I have reviewed the patients History and Physical, chart, labs and discussed the procedure including the risks, benefits and alternatives for the proposed anesthesia with the patient or authorized representative who has indicated his/her understanding and acceptance.   Dental Advisory Given  Plan Discussed with: CRNA and Surgeon  Anesthesia Plan Comments: (  )        Anesthesia Quick Evaluation

## 2017-12-30 NOTE — Discharge Instructions (Signed)
[facility] _______[facility] ° °UMBILICAL OR INGUINAL HERNIA REPAIR: POST OP INSTRUCTIONS ° °Always review your discharge instruction sheet given to you by the facility where your surgery was performed. °IF YOU HAVE DISABILITY OR FAMILY LEAVE FORMS, YOU MUST BRING THEM TO THE OFFICE FOR PROCESSING.   °DO NOT GIVE THEM TO YOUR DOCTOR. ° °1. A  prescription for pain medication may be given to you upon discharge.  Take your pain medication as prescribed, if needed.  If narcotic pain medicine is not needed, then you may take acetaminophen (Tylenol) or ibuprofen (Advil) as needed. °2. Take your usually prescribed medications unless otherwise directed. °If you need a refill on your pain medication, please contact your pharmacy.  They will contact our office to request authorization. Prescriptions will not be filled after 5 pm or on week-ends. °3. You should follow a light diet the first 24 hours after arrival home, such as soup and crackers, etc.  Be sure to include lots of fluids daily.  Resume your normal diet the day after surgery. °4.Most patients will experience some swelling and bruising around the umbilicus or in the groin and scrotum.  Ice packs and reclining will help.  Swelling and bruising can take several days to resolve.  °6. It is common to experience some constipation if taking pain medication after surgery.  Increasing fluid intake and taking a stool softener (such as Colace) will usually help or prevent this problem from occurring.  A mild laxative (Milk of Magnesia or Miralax) should be taken according to package directions if there are no bowel movements after 48 hours. °7. Unless discharge instructions indicate otherwise, you may remove your bandages 24-48 hours after surgery, and you may shower at that time.  You may have steri-strips (small skin tapes) in place directly over the incision.  These strips should be left on the skin for 7-10 days.  If your surgeon used skin glue on the  incision, you may shower in 24 hours.  The glue will flake off over the next 2-3 weeks.  Any sutures or staples will be removed at the office during your follow-up visit. °8. ACTIVITIES:  You may resume regular (light) daily activities beginning the next day--such as daily self-care, walking, climbing stairs--gradually increasing activities as tolerated.  You may have sexual intercourse when it is comfortable.  Refrain from any heavy lifting or straining until approved by your doctor. ° °a.You may drive when you are no longer taking prescription pain medication, you can comfortably wear a seatbelt, and you can safely maneuver your car and apply brakes. °b.RETURN TO WORK:   °_____________________________________________ ° °9.You should see your doctor in the office for a follow-up appointment approximately 2-3 weeks after your surgery.  Make sure that you call for this appointment within a day or two after you arrive home to insure a convenient appointment time. °10.OTHER INSTRUCTIONS: _________________________ °   _____________________________________ ° °WHEN TO CALL YOUR DOCTOR: °1. Fever over 101.0 °2. Inability to urinate °3. Nausea and/or vomiting °4. Extreme swelling or bruising °5. Continued bleeding from incision. °6. Increased pain, redness, or drainage from the incision ° °The clinic staff is available to answer your questions during regular business hours.  Please don’t hesitate to call and ask to speak to one of the nurses for clinical concerns.  If you have a medical emergency, go to the nearest emergency room or call 911.  A surgeon from [facility] is always on call at the hospital ° ° °  [address] [place], [place], [place]  [address] ?  [address], [place], [place]   [address] [phone] ? [phone] ? FAX [phone] Web site: [email or web address]  [facility] _____[facility] LAPAROSCOPIC SURGERY: POST OP INSTRUCTIONS Always review your  discharge instruction sheet given to you by the facility where your surgery was performed. IF YOU HAVE DISABILITY OR FAMILY LEAVE FORMS, YOU MUST BRING THEM TO THE OFFICE FOR PROCESSING.   DO NOT GIVE THEM TO YOUR DOCTOR.  1. A prescription for pain medication may be given to you upon discharge.  Take your pain medication as prescribed, if needed.  If narcotic pain medicine is not needed, then you may take acetaminophen (Tylenol) or ibuprofen (Advil) as needed. 2. Take your usually prescribed medications unless otherwise directed. 3. If you need a refill on your pain medication, please contact your pharmacy.  They will contact our office to request authorization. Prescriptions will not be filled after 5pm or on week-ends. 4. You should follow a light diet the first few days after arrival home, such as soup and crackers, etc.  Be sure to include lots of fluids daily. 5. Most patients will experience some swelling and bruising in the area of the incisions.  Ice packs will help.  Swelling and bruising can take several days to resolve.  6. It is common to experience some constipation if taking pain medication after surgery.  Increasing fluid intake and taking a stool softener (such as Colace) will usually help or prevent this problem from occurring.  A mild laxative (Milk of Magnesia or Miralax) should be taken according to package instructions if there are no bowel movements after 48 hours. 7. Unless discharge instructions indicate otherwise, you may remove your bandages 24-48 hours after surgery, and you may shower at that time.  You may have steri-strips (small skin tapes) in place directly over the incision.  These strips should be left on the skin for 7-10 days.  If your surgeon used skin glue on the incision, you may shower in 24 hours.  The glue will flake off over the next 2-3 weeks.  Any sutures or staples will be removed at the office during your follow-up visit. 8. ACTIVITIES:  You may resume  regular (light) daily activities beginning the next day--such as daily self-care, walking, climbing stairs--gradually increasing activities as tolerated.  You may have sexual intercourse when it is comfortable.  Refrain from any heavy lifting or straining until approved by your doctor. a. You may drive when you are no longer taking prescription pain medication, you can comfortably wear a seatbelt, and you can safely maneuver your car and apply brakes. b. RETURN TO WORK:  __________________________________________________________ 9. You should see your doctor in the office for a follow-up appointment approximately 2-3 weeks after your surgery.  Make sure that you call for this appointment within a day or two after you arrive home to insure a convenient appointment time. 10. OTHER INSTRUCTIONS: __________________________________________________________________________________________________________________________ __________________________________________________________________________________________________________________________ WHEN TO CALL YOUR DOCTOR: 7. Fever over 101.0 8. Inability to urinate 9. Continued bleeding from incision. 10. Increased pain, redness, or drainage from the incision. 11. Increasing abdominal pain  The clinic staff is available to answer your questions during regular business hours.  Please dont hesitate to call and ask to speak to one of the nurses for clinical concerns.  If you have a medical emergency, go to the nearest emergency room or call 911.  A surgeon from [facility] is always on  call at the hospital. [address] [place], [place], [place]  [address] ? [address], [place], [place]   [address] [phone] ? [phone] ? FAX [phone] Web site: [email or web address]

## 2017-12-30 NOTE — Transfer of Care (Signed)
Immediate Anesthesia Transfer of Care Note  Patient: Bradley Vasquez  Procedure(s) Performed: LAPAROSCOPIC INCISIONAL HERNIA WITH MESH  OPEN INCISIONAL HERNIA (N/A ) INSERTION OF MESH (N/A )  Patient Location: PACU  Anesthesia Type:General  Level of Consciousness: awake and alert   Airway & Oxygen Therapy: Patient Spontanous Breathing and Patient connected to nasal cannula oxygen  Post-op Assessment: Report given to RN, Post -op Vital signs reviewed and stable and Patient moving all extremities X 4  Post vital signs: Reviewed and stable  Last Vitals:  Vitals:   12/30/17 1139 12/30/17 1549  BP: (!) 181/84   Pulse: (!) 59   Resp: 18   Temp: 36.4 C (!) 36.2 C  SpO2: 99%     Last Pain:  Vitals:   12/30/17 1139  TempSrc: Oral      Patients Stated Pain Goal: 0 ([id number])  Complications: No apparent anesthesia complications

## 2017-12-30 NOTE — Op Note (Signed)
Preoperative diagnosis: Incisional hernia nonobstructed reducible  Postoperative diagnosis: 2 cm incisional hernia  Procedure: Laparoscopic converted to open incisional hernia repair with mesh  Surgeon: Erroll Luna MD  Anesthesia: General with local  EBL: 10 cc  Specimen: None  Drains: None  IV fluids: Per anesthesia record  Indications for procedure: The patient presents for repair of a small lower abdominal incisional hernia from previous prostatectomy.  On examination he had a large bulge just above his pubis.  On exam this reduced easily.  He desired repair since he was becoming more symptomatic.The risk of hernia repair include bleeding,  Infection,   Recurrence of the hernia,  Mesh use, chronic pain,  Organ injury,  Bowel injury,  Bladder injury,   nerve injury with numbness around the incision,  Death,  and worsening of preexisting  medical problems.  The alternatives to surgery have been discussed as well..  Long term expectations of both operative and non operative treatments have been discussed.   The patient agrees to proceed.    Description of procedure: The patient was met in the holding area.  Questions were answered.  The procedure was reviewed with his family and the patient.  Complications and long-term expectations discussed.  The use of mesh and the pros and cons of this discussed.  He was taken the operating room.  He was placed upon the operating table.  After induction of general anesthesia both arms were tucked and a Foley catheter was placed under sterile conditions.  He received 2 g of Ancef.  He underwent sterile prep and drape of the abdominal wall and inguinal region.  Timeout was done.  He received appropriate antibiotics and proper procedure and patient noted.  A 5 mm Optiview port was placed in the right upper quadrant.  This was done with local anesthetic under direct vision without injury to internal viscera.  All abdominal wall layers were visualized and the  catheter went in easily.  Pneumoperitoneum was then obtained to 15 mmHg.  A 5 Miller scope was placed and there is no evidence of injury from the trocar placement.  Upon examination the patient a very small 2 cm fascial defect just above the pubis.  This corresponded to his hernia.  The remainder of the abdominal wall looks very good with good strong fascia.  Given the small defect I felt that a small open repair was best suited for him.  Incision was then made in the left upper quadrant and right lower quadrants for additional 5-minute reports to further assess the hernia.  Graspers were placed and indeed this was a very small defect with good fascia all around it.  I opted to make an incision over the fascial defect and repair this primarily.  The CO2 was allowed to escape.  A 4 cm incision was made.  Dissection was carried down to the subcutaneous tissues just above the pubis down to the hernia defect.  I opened the defect.  There is about a 2 cm separation of his rectus abdominis muscles with a defect was located.  I remained in the preperitoneal space and did develop this plane carefully just behind the pubis.  The bladder is decompressed with a Foley and out of the operative field.  The space was then dissected out circumferentially for 4-5 cm in all directions below the rectus muscles.  An 8 cm circular ventral light mesh was then used.  This was placed in a submuscular position.  I placed 4 tacking sutures to  secure it to the undersurface of the lower rectus muscles bilaterally and the fascia space superiorly.  I then placed a suture inferiorly through the mesh and through the anterior fascia of the pubis and then back through the mesh internally.  This was tied down to help pull the mesh up underneath the pubis.  I then closed the fascia over the mesh with interrupted #1 Novafil sutures.  The mesh was secured as well with #1 Novafil suture.  I then reinsufflated the abdominal cavity to examine the repair.   The mesh lied flush within the preperitoneal space.  He was up underneath the pubis as was sutured.  The bladder was well away from this.  I then removed my ports after doing a four-quadrant laparoscopy and seeing no evidence of internal injury.  Ports were passed off the field.  Skin incisions were closed with 3-0 Vicryl and 4-0 Monocryl.  All sites were infiltrated with local anesthetic.  All final counts were found to be correct sponge, needle and instruments.  The patient was then awoke extubated taken recovery in satisfactory condition.  His Foley catheter was removed of note.  The urine was clear.

## 2017-12-30 NOTE — Interval H&P Note (Signed)
History and Physical Interval Note:  12/30/2017 1:26 PM  Bradley Vasquez  has presented today for surgery, with the diagnosis of incisional hernia  The various methods of treatment have been discussed with the patient and family. After consideration of risks, benefits and other options for treatment, the patient has consented to  Procedure(s): [place] (N/A) INSERTION OF MESH (N/A) as a surgical intervention .  The patient's history has been reviewed, patient examined, no change in status, stable for surgery.  I have reviewed the patient's chart and labs.  Questions were answered to the patient's satisfaction.     [place]

## 2017-12-30 NOTE — Anesthesia Postprocedure Evaluation (Signed)
Anesthesia Post Note  Patient: Bradley Vasquez  Procedure(s) Performed: LAPAROSCOPIC INCISIONAL HERNIA WITH MESH  OPEN INCISIONAL HERNIA (N/A ) INSERTION OF MESH (N/A )     Patient location during evaluation: PACU Anesthesia Type: General Level of consciousness: awake and alert, patient cooperative and oriented Pain management: pain level controlled Vital Signs Assessment: post-procedure vital signs reviewed and stable Respiratory status: spontaneous breathing, nonlabored ventilation and respiratory function stable Cardiovascular status: blood pressure returned to baseline and stable Postop Assessment: no apparent nausea or vomiting Anesthetic complications: no    Last Vitals:  Vitals:   12/30/17 1610 12/30/17 1640  BP: (!) 148/79 (!) 144/73  Pulse: 75 71  Resp: (!) 21 18  Temp: (!) 36.2 C (!) 36.2 C  SpO2: 98% 96%    Last Pain:  Vitals:   12/30/17 1610  TempSrc:   PainSc: 0-No pain                 Tasnia Spegal,E. Braylea Brancato

## 2017-12-31 ENCOUNTER — Encounter (HOSPITAL_COMMUNITY): Payer: Self-pay | Admitting: Surgery

## 2018-08-09 DIAGNOSIS — M722 Plantar fascial fibromatosis: Secondary | ICD-10-CM | POA: Insufficient documentation

## 2021-07-17 DIAGNOSIS — L600 Ingrowing nail: Secondary | ICD-10-CM | POA: Insufficient documentation

## 2021-07-17 DIAGNOSIS — L608 Other nail disorders: Secondary | ICD-10-CM | POA: Insufficient documentation

## 2021-09-19 ENCOUNTER — Telehealth: Payer: Self-pay | Admitting: Emergency Medicine

## 2021-09-19 NOTE — Telephone Encounter (Signed)
Tried to call patient per Dr. Agustin Cree for appointment no answer and no voicemail.

## 2021-09-23 NOTE — Telephone Encounter (Signed)
Left message for patient to return call.

## 2021-09-23 NOTE — Telephone Encounter (Signed)
Spoke to patient daughter got appointment moved up.

## 2021-09-24 ENCOUNTER — Other Ambulatory Visit: Payer: Self-pay

## 2021-09-24 DIAGNOSIS — D689 Coagulation defect, unspecified: Secondary | ICD-10-CM | POA: Insufficient documentation

## 2021-09-24 DIAGNOSIS — K759 Inflammatory liver disease, unspecified: Secondary | ICD-10-CM | POA: Insufficient documentation

## 2021-09-24 DIAGNOSIS — M1990 Unspecified osteoarthritis, unspecified site: Secondary | ICD-10-CM | POA: Insufficient documentation

## 2021-09-24 DIAGNOSIS — K838 Other specified diseases of biliary tract: Secondary | ICD-10-CM | POA: Insufficient documentation

## 2021-09-24 DIAGNOSIS — N401 Enlarged prostate with lower urinary tract symptoms: Secondary | ICD-10-CM | POA: Insufficient documentation

## 2021-09-24 DIAGNOSIS — I10 Essential (primary) hypertension: Secondary | ICD-10-CM | POA: Insufficient documentation

## 2021-09-24 DIAGNOSIS — H269 Unspecified cataract: Secondary | ICD-10-CM | POA: Insufficient documentation

## 2021-09-25 ENCOUNTER — Encounter: Payer: Self-pay | Admitting: Cardiology

## 2021-09-25 ENCOUNTER — Other Ambulatory Visit: Payer: Self-pay

## 2021-09-25 ENCOUNTER — Ambulatory Visit: Payer: 59 | Admitting: Cardiology

## 2021-09-25 VITALS — BP 124/72 | HR 57 | Ht 73.0 in | Wt 178.2 lb

## 2021-09-25 DIAGNOSIS — I471 Supraventricular tachycardia, unspecified: Secondary | ICD-10-CM | POA: Insufficient documentation

## 2021-09-25 DIAGNOSIS — I472 Ventricular tachycardia, unspecified: Secondary | ICD-10-CM | POA: Diagnosis not present

## 2021-09-25 DIAGNOSIS — G4719 Other hypersomnia: Secondary | ICD-10-CM

## 2021-09-25 DIAGNOSIS — I10 Essential (primary) hypertension: Secondary | ICD-10-CM

## 2021-09-25 DIAGNOSIS — R9431 Abnormal electrocardiogram [ECG] [EKG]: Secondary | ICD-10-CM | POA: Diagnosis not present

## 2021-09-25 DIAGNOSIS — G4733 Obstructive sleep apnea (adult) (pediatric): Secondary | ICD-10-CM

## 2021-09-25 MED ORDER — METOPROLOL TARTRATE 25 MG PO TABS: 25.0000 mg | ORAL_TABLET | Freq: Every day | ORAL | 1 refills | Status: DC

## 2021-09-25 NOTE — Patient Instructions (Signed)
Medication Instructions:  Your physician has recommended you make the following change in your medication:  START: Metoprolol tartrate 25 mg once daily in the morning   *If you need a refill on your cardiac medications before your next appointment, please call your pharmacy*   Lab Work: None If you have labs (blood work) drawn today and your tests are completely normal, you will receive your results only by: [place] (if you have MyChart) OR A paper copy in the mail If you have any lab test that is abnormal or we need to change your treatment, we will call you to review the results.   Testing/Procedures: Your physician has requested that you have an echocardiogram. Echocardiography is a painless test that uses sound waves to create images of your heart. It provides your doctor with information about the size and shape of your heart and how well your heart's chambers and valves are working. This procedure takes approximately one hour. There are no restrictions for this procedure.    [facility] [address] [place], [place] [address] Phone:  [phone]    Please arrive 15 minutes prior to your appointment time for registration and insurance purposes.  The test will take approximately 3 to 4 hours to complete; you may bring reading material.  If someone comes with you to your appointment, they will need to remain in the main lobby due to limited space in the testing area. **If you are pregnant or breastfeeding, please notify the nuclear lab prior to your appointment**  How to prepare for your Myocardial Perfusion Test: Do not eat or drink 3 hours prior to your test, except you may have water. Do not consume products containing caffeine (regular or decaffeinated) 12 hours prior to your test. (ex: coffee, chocolate, sodas, tea). Do bring a list of your current medications with you.  If not listed below, you may take your medications as normal.  Do  wear comfortable clothes (no dresses or overalls) and walking shoes, tennis shoes preferred (No heels or open toe shoes are allowed). Do NOT wear cologne, perfume, aftershave, or lotions (deodorant is allowed). If these instructions are not followed, your test will have to be rescheduled.  Please report to [address] for your test.  If you have questions or concerns about your appointment, you can call the [facility] at [phone].  If you cannot keep your appointment, please provide 24 hours notification to the Nuclear Lab, to avoid a possible $50 charge to your account.    Follow-Up: At [facility], you and your health needs are our priority.  As part of our continuing mission to provide you with exceptional heart care, we have created designated Provider Care Teams.  These Care Teams include your primary Cardiologist (physician) and Advanced Practice Providers (APPs -  Physician Assistants and Nurse Practitioners) who all work together to provide you with the care you need, when you need it.  We recommend signing up for the patient portal called "MyChart".  Sign up information is provided on this After Visit Summary.  MyChart is used to connect with patients for Virtual Visits (Telemedicine).  Patients are able to view lab/test results, encounter notes, upcoming appointments, etc.  Non-urgent messages can be sent to your provider as well.   To learn more about what you can do with MyChart, go to [email or web address].    Your next appointment:   6 week(s)  The format for your next  appointment:   In Person  Provider:   Jenne Campus, MD   Other Instructions  Metoprolol Tablets What is this medication? METOPROLOL (me TOE proe lole) treats high blood pressure. It also prevents chest pain (angina) or further damage after a heart attack. It works by lowering your blood pressure and heart rate, making it easier for your heart to pump  blood to the rest of your body. It belongs to a group of medications called beta blockers. This medicine may be used for other purposes; ask your health care provider or pharmacist if you have questions. COMMON BRAND NAME(S): Lopressor What should I tell my care team before I take this medication? They need to know if you have any of these conditions: Diabetes Heart or vessel disease like slow heart rate, worsening heart failure, heart block, sick sinus syndrome, or Raynaud's disease Kidney disease Liver disease Lung or breathing disease, like asthma or emphysema Pheochromocytoma Thyroid disease An unusual or allergic reaction to metoprolol, other beta blockers, medications, foods, dyes, or preservatives Pregnant or trying to get pregnant Breast-feeding How should I use this medication? Take this medication by mouth with water. Take it as directed on the prescription label at the same time every day. You can take it with or without food. You should always take it the same way. Keep taking it unless your care team tells you to stop. Talk to your care team about the use of this medication in children. Special care may be needed. Overdosage: If you think you have taken too much of this medicine contact a poison control center or emergency room at once. NOTE: This medicine is only for you. Do not share this medicine with others. What if I miss a dose? If you miss a dose, take it as soon as you can. If it is almost time for your next dose, take only that dose. Do not take double or extra doses. What may interact with this medication? This medication may interact with the following: Certain medications for blood pressure, heart disease, irregular heartbeat Certain medications for depression like monoamine oxidase (MAO) inhibitors, fluoxetine, or paroxetine Clonidine Dobutamine Epinephrine Isoproterenol Reserpine This list may not describe all possible interactions. Give your health care  provider a list of all the medicines, herbs, non-prescription drugs, or dietary supplements you use. Also tell them if you smoke, drink alcohol, or use illegal drugs. Some items may interact with your medicine. What should I watch for while using this medication? Visit your care team for regular checks on your progress. Check your blood pressure as directed. Ask your care team what your blood pressure should be. Also, find out when you should contact them. Do not treat yourself for coughs, colds, or pain while you are using this medication without asking your care team for advice. Some medications may increase your blood pressure. You may get drowsy or dizzy. Do not drive, use machinery, or do anything that needs mental alertness until you know how this medication affects you. Do not stand up or sit up quickly, especially if you are an older patient. This reduces the risk of dizzy or fainting spells. Alcohol may interfere with the effect of this medication. Avoid alcoholic drinks. This medication may increase blood sugar. Ask your care team if changes in diet or medications are needed if you have diabetes. What side effects may I notice from receiving this medication? Side effects that you should report to your care team as soon as possible: Allergic reactions-skin rash, itching,  hives, swelling of the face, lips, tongue, or throat Heart failure-shortness of breath, swelling of the ankles, feet, or hands, sudden weight gain, unusual weakness or fatigue Low blood pressure-dizziness, feeling faint or lightheaded, blurry vision Raynaud's-cool, numb, or painful fingers or toes that may change color from pale, to blue, to red Slow heartbeat-dizziness, feeling faint or lightheaded, confusion, trouble breathing, unusual weakness or fatigue Worsening mood, feelings of depression Side effects that usually do not require medical attention (report to your care team if they continue or are bothersome): Change in  sex drive or performance Diarrhea Dizziness Fatigue Headache This list may not describe all possible side effects. Call your doctor for medical advice about side effects. You may report side effects to FDA at 1-800-FDA-1088. Where should I keep my medication? Keep out of the reach of children and pets. Store at room temperature between 15 and 30 degrees C (59 and 86 degrees F). Protect from moisture. Keep the container tightly closed. Throw away any unused medication after the expiration date. NOTE: This sheet is a summary. It may not cover all possible information. If you have questions about this medicine, talk to your doctor, pharmacist, or health care provider.  2022 Elsevier/Gold Standard (2020-12-19 13:08:14)  Echocardiogram An echocardiogram is a test that uses sound waves (ultrasound) to produce images of the heart. Images from an echocardiogram can provide important information about: Heart size and shape. The size and thickness and movement of your heart's walls. Heart muscle function and strength. Heart valve function or if you have stenosis. Stenosis is when the heart valves are too narrow. If blood is flowing backward through the heart valves (regurgitation). A tumor or infectious growth around the heart valves. Areas of heart muscle that are not working well because of poor blood flow or injury from a heart attack. Aneurysm detection. An aneurysm is a weak or damaged part of an artery wall. The wall bulges out from the normal force of blood pumping through the body. Tell a health care provider about: Any allergies you have. All medicines you are taking, including vitamins, herbs, eye drops, creams, and over-the-counter medicines. Any blood disorders you have. Any surgeries you have had. Any medical conditions you have. Whether you are pregnant or may be pregnant. What are the risks? Generally, this is a safe test. However, problems may occur, including an allergic  reaction to dye (contrast) that may be used during the test. What happens before the test? No specific preparation is needed. You may eat and drink normally. What happens during the test?  You will take off your clothes from the waist up and put on a hospital gown. Electrodes or electrocardiogram (ECG)patches may be placed on your chest. The electrodes or patches are then connected to a device that monitors your heart rate and rhythm. You will lie down on a table for an ultrasound exam. A gel will be applied to your chest to help sound waves pass through your skin. A handheld device, called a transducer, will be pressed against your chest and moved over your heart. The transducer produces sound waves that travel to your heart and bounce back (or "echo" back) to the transducer. These sound waves will be captured in real-time and changed into images of your heart that can be viewed on a video monitor. The images will be recorded on a computer and reviewed by your health care provider. You may be asked to change positions or hold your breath for a short time. This makes  it easier to get different views or better views of your heart. In some cases, you may receive contrast through an IV in one of your veins. This can improve the quality of the pictures from your heart. The procedure may vary among health care providers and hospitals. What can I expect after the test? You may return to your normal, everyday life, including diet, activities, and medicines, unless your health care provider tells you not to do that. Follow these instructions at home: It is up to you to get the results of your test. Ask your health care provider, or the department that is doing the test, when your results will be ready. Keep all follow-up visits. This is important. Summary An echocardiogram is a test that uses sound waves (ultrasound) to produce images of the heart. Images from an echocardiogram can provide important  information about the size and shape of your heart, heart muscle function, heart valve function, and other possible heart problems. You do not need to do anything to prepare before this test. You may eat and drink normally. After the echocardiogram is completed, you may return to your normal, everyday life, unless your health care provider tells you not to do that. This information is not intended to replace advice given to you by your health care provider. Make sure you discuss any questions you have with your health care provider. Document Revised: 07/09/2020 Document Reviewed: 07/09/2020 Elsevier Patient Education  2022 [place].  Cardiac Nuclear Scan A cardiac nuclear scan is a test that measures blood flow to the heart when a person is resting and when he or she is exercising. The test looks for problems such as: Not enough blood reaching a portion of the heart. The heart muscle not working normally. You may need this test if: You have heart disease. You have had abnormal lab results. You have had heart surgery or a balloon procedure to open up blocked arteries (angioplasty). You have chest pain. You have shortness of breath. In this test, a radioactive dye (tracer) is injected into your bloodstream. After the tracer has traveled to your heart, an imaging device is used to measure how much of the tracer is absorbed by or distributed to various areas of your heart. This procedure is usually done at a hospital and takes 2-4 hours. Tell a health care provider about: Any allergies you have. All medicines you are taking, including vitamins, herbs, eye drops, creams, and over-the-counter medicines. Any problems you or family members have had with anesthetic medicines. Any blood disorders you have. Any surgeries you have had. Any medical conditions you have. Whether you are pregnant or may be pregnant. What are the risks? Generally, this is a safe procedure. However, problems may occur,  including: Serious chest pain and heart attack. This is only a risk if the stress portion of the test is done. Rapid heartbeat. Sensation of warmth in your chest. This usually passes quickly. Allergic reaction to the tracer. What happens before the procedure? Ask your health care provider about changing or stopping your regular medicines. This is especially important if you are taking diabetes medicines or blood thinners. Follow instructions from your health care provider about eating or drinking restrictions. Remove your jewelry on the day of the procedure. What happens during the procedure? An IV will be inserted into one of your veins. Your health care provider will inject a small amount of radioactive tracer through the IV. You will wait for 20-40 minutes while the tracer travels  through your bloodstream. Your heart activity will be monitored with an electrocardiogram (ECG). You will lie down on an exam table. Images of your heart will be taken for about 15-20 minutes. You may also have a stress test. For this test, one of the following may be done: You will exercise on a treadmill or stationary bike. While you exercise, your heart's activity will be monitored with an ECG, and your blood pressure will be checked. You will be given medicines that will increase blood flow to parts of your heart. This is done if you are unable to exercise. When blood flow to your heart has peaked, a tracer will again be injected through the IV. After 20-40 minutes, you will get back on the exam table and have more images taken of your heart. Depending on the type of tracer used, scans may need to be repeated 3-4 hours later. Your IV line will be removed when the procedure is over. The procedure may vary among health care providers and hospitals. What happens after the procedure? Unless your health care provider tells you otherwise, you may return to your normal schedule, including diet, activities, and  medicines. Unless your health care provider tells you otherwise, you may increase your fluid intake. This will help to flush the contrast dye from your body. Drink enough fluid to keep your urine pale yellow. Ask your health care provider, or the department that is doing the test: When will my results be ready? How will I get my results? Summary A cardiac nuclear scan measures the blood flow to the heart when a person is resting and when he or she is exercising. Tell your health care provider if you are pregnant. Before the procedure, ask your health care provider about changing or stopping your regular medicines. This is especially important if you are taking diabetes medicines or blood thinners. After the procedure, unless your health care provider tells you otherwise, increase your fluid intake. This will help flush the contrast dye from your body. After the procedure, unless your health care provider tells you otherwise, you may return to your normal schedule, including diet, activities, and medicines. This information is not intended to replace advice given to you by your health care provider. Make sure you discuss any questions you have with your health care provider. Document Revised: 05/02/2018 Document Reviewed: 05/02/2018 Elsevier Patient Education  [place].   Sleep Study, Adult A sleep study (polysomnogram) is a series of tests done while you are sleeping. A sleep study records your brain waves, heart rate, breathing rate, oxygen level, and eye and leg movements. A sleep study helps your health care provider: See how well you sleep. Diagnose a sleep disorder. Determine how severe your sleep disorder is. Create a plan to treat your sleep disorder. Your health care provider may recommend a sleep study if you: Feel sleepy on most days. Snore loudly while sleeping. Have unusual behaviors while you sleep, such as walking. Have brief periods in which you stop breathing  during sleep (sleepapnea). Fall asleep suddenly during the day (narcolepsy). Have trouble falling asleep or staying asleep (insomnia). Feel like you need to move your legs when trying to fall asleep (restless legs syndrome). Move your legs by flexing and extending them regularly while asleep (periodic limb movement disorder). Act out your dreams while you sleep (sleep behavior disorder). Feel like you cannot move when you first wake up (sleep paralysis). What tests are part of a sleep study? Most sleep studies record  the following during sleep: Brain activity. Eye movements. Heart rate and rhythm. Breathing rate and rhythm. Blood-oxygen level. Blood pressure. Chest and belly movement as you breathe. Arm and leg movements. Snoring or other noises. Body position. Where are sleep studies done? Sleep studies are done at sleep centers. A sleep center may be inside a hospital, office, or clinic. The room where you have the study may look like a hospital room or a hotel room. The health care providers doing the study may come in and out of the room during the study. Most of the time, they will be in another room monitoring your test as you sleep. How are sleep studies done? Most sleep studies are done during a normal period of time for a full night of sleep. You will arrive at the study center in the evening and go home in the morning. Before the test Bring your pajamas and toothbrush with you to the sleep study. Do not have caffeine on the day of your sleep study. Do not drink alcohol on the day of your sleep study. Your health care provider will let you know if you should stop taking any of your regular medicines before the test. During the test   Round, sticky patches with sensors attached to recording wires (electrodes) are placed on your scalp, face, chest, and limbs. Wires from all the electrodes and sensors run from your bed to a computer. The wires can be taken off and put back on if  you need to get out of bed to go to the bathroom. A sensor is placed over your nose to measure airflow. A finger clip is put on your finger or ear to measure your blood oxygen level (pulse oximetry). A belt is placed around your belly and a belt is placed around your chest to measure breathing movements. If you have signs of the sleep disorder called sleep apnea during your test, you may get a treatment mask to wear for the second half of the night. The mask provides positive airway pressure (PAP) to help you breathe better during sleep. This may greatly improve your sleep apnea. You will then have all tests done again with the mask in place to see if your measurements and recordings change. After the test A medical doctor who specializes in sleep will evaluate the results of your sleep study and share them with you and your primary health care provider. Based on your results, your medical history, and a physical exam, you may be diagnosed with a sleep disorder, such as: Sleep apnea. Restless legs syndrome. Sleep-related behavior disorder. Sleep-related movement disorders. Sleep-related seizure disorders. Your health care team will help determine your treatment options based on your diagnosis. This may include: Improving your sleep habits (sleep hygiene). Wearing a continuous positive airway pressure (CPAP) or bi-level positive airway pressure (BPAP) mask. Wearing an oral device at night to improve breathing and reduce snoring. Taking medicines. Follow these instructions at home: Take over-the-counter and prescription medicines only as told by your health care provider. If you are instructed to use a CPAP or BPAP mask, make sure you use it nightly as directed. Make any lifestyle changes that your health care provider recommends. If you were given a device to open your airway while you sleep, use it only as told by your health care provider. Do not use any tobacco products, such as  cigarettes, chewing tobacco, and e-cigarettes. If you need help quitting, ask your health care provider. Keep all follow-up visits as  told by your health care provider. This is important. Summary A sleep study (polysomnogram) is a series of tests done while you are sleeping. It shows how well you sleep. Most sleep studies are done over one full night of sleep. You will arrive at the study center in the evening and go home in the morning. If you have signs of the sleep disorder called sleep apnea during your test, you may get a treatment mask to wear for the second half of the night. A medical doctor who specializes in sleep will evaluate the results of your sleep study and share them with your primary health care provider. This information is not intended to replace advice given to you by your health care provider. Make sure you discuss any questions you have with your health care provider. Document Revised: 12/22/2019 Document Reviewed: 12/14/2017 Elsevier Patient Education  2022 [employer].

## 2021-09-25 NOTE — Progress Notes (Signed)
Cardiology Consultation:    Date:  09/25/2021   ID:  Bradley Vasquez, DOB 1933/01/12, MRN [id number]  PCP:  Maryella Shivers, MD  Cardiologist:  Jenne Campus, MD   Referring MD: Maryella Shivers, MD   Chief Complaint  Patient presents with   Abnormal Rhythm    History of Present Illness:    Bradley Vasquez is a 87 y.o. male who is being seen today for the evaluation of ventricular tachycardia, supraventricular tachycardia at the request of Maryella Shivers, MD. past medical history significant for essential hypertension, hyperbilirubinemia, prostate hypertrophy.  He went to his primary care physician couple weeks ago complaining of having some palpitations.  There was a situation that he has felt his heart speeding up lasted for few minutes.  He did not get dizzy he did not have any chest pain sweating shortness of breath he was completely asymptomatic except for palpitations.  That prompted Zio patch analysis which showed evidence of nonsustained ventricular tachycardia only 4 beats, he also got supraventricular tachycardia.  Interestingly in the early morning hours he will have significant bradycardia with some nonconducted APCs.  He is very active in spite of his age he is always on the go.  He denies have any dizziness or passing out.  There is no chest pain tightness squeezing pressure burning chest.  He is coming to my office with his daughter.  He does not smoke, never did.  He does not have family history of premature coronary artery disease.  It a matter fact, multiple family members that live long time in his family.  Past Medical History:  Diagnosis Date   Arthritis    Cataract    bilateral cateracts removed   Clotting disorder ([place]) 18 years ago   ITP   Dilated intrahepatic bile duct    Hepatitis    hepatitis b years ago ywhen in [employer]   HTN (hypertension), benign    Hyperbilirubinemia    Hypertrophy of prostate with urinary obstruction     Past Surgical  History:  Procedure Laterality Date   CHOLECYSTECTOMY     COLONOSCOPY  2016   EYE SURGERY Bilateral    lens replacement for cataracts   HERNIA REPAIR Bilateral    INCISIONAL HERNIA REPAIR N/A 12/30/2017   Procedure: LAPAROSCOPIC INCISIONAL HERNIA WITH MESH  OPEN INCISIONAL HERNIA;  Surgeon: Erroll Luna, MD;  Location: [facility];  Service: General;  Laterality: N/A;   INSERTION OF MESH N/A 12/30/2017   Procedure: INSERTION OF MESH;  Surgeon: Erroll Luna, MD;  Location: [facility];  Service: General;  Laterality: N/A;   melanoma removed from face  10 years ago   PROSTATECTOMY N/A 06/13/2015   Procedure:  OPEN SURPAPUBIC PROSTATECTOMY, AND CYSTOLITHOLAPAXY ;  Surgeon: Carolan Clines, MD;  Location: [facility];  Service: Urology;  Laterality: N/A;    Current Medications: Current Meds  Medication Sig   escitalopram (LEXAPRO) 10 MG tablet Take 10 mg by mouth daily.   lisinopril-hydrochlorothiazide (PRINZIDE,ZESTORETIC) 20-12.5 MG tablet Take 1 tablet by mouth daily.   MELATONIN PO Take 1 tablet by mouth at bedtime. Unknown strength   metoprolol tartrate (LOPRESSOR) 25 MG tablet Take 1 tablet (25 mg total) by mouth daily.   Multiple Vitamin (MULTIVITAMIN) capsule Take 1 capsule by mouth daily. Unknown strength   OVER THE COUNTER MEDICATION Take 1 tablet by mouth daily. Prevagen With Lisinopril/ Unknown strength     Allergies:   Patient has no known allergies.   Social History   Socioeconomic History  Marital status: Single    Spouse name: Not on file   Number of children: Not on file   Years of education: Not on file   Highest education level: Not on file  Occupational History   Occupation: retired  Tobacco Use   Smoking status: Never   Smokeless tobacco: Never  Vaping Use   Vaping Use: Never used  Substance and Sexual Activity   Alcohol use: No    Alcohol/week: 0.0 standard drinks   Drug use: No   Sexual activity: Not on file  Other Topics Concern   Not on file  Social  History Narrative   Not on file   Social Determinants of Health   Financial Resource Strain: Not on file  Food Insecurity: Not on file  Transportation Needs: Not on file  Physical Activity: Not on file  Stress: Not on file  Social Connections: Not on file     Family History: The patient's family history is negative for Colon cancer, Esophageal cancer, Rectal cancer, and Stomach cancer. ROS:   Please see the history of present illness.    All 14 point review of systems negative except as described per history of present illness.  EKGs/Labs/Other Studies Reviewed:    The following studies were reviewed today: Zio patch showed minimum heart rate 41 maximum 198 with average heart rate 61.  He had 8 episode of ventricular tachycardia with the longest episode 4 beats at rate of 179.  He also got 72 episode of supraventricular tachycardia fastest episode 15.5 seconds at rate of 190 longest episode 37.4 seconds.  He also had some nonconducted APCs that happened in the middle of the early morning hours.  EKG:  EKG is  ordered today.  The ekg ordered today demonstrates sinus bradycardia with normal P interval nonspecific ST segment changes  Recent Labs: No results found for requested labs within last 8760 hours.  Recent Lipid Panel No results found for: CHOL, TRIG, HDL, CHOLHDL, VLDL, LDLCALC, LDLDIRECT  Physical Exam:    VS:  BP 124/72 (BP Location: Left Arm, Patient Position: Sitting)   Pulse (!) 57   Ht 6\' 1"  (1.854 m)   Wt 178 lb 3.2 oz (80.8 kg)   SpO2 99%   BMI 23.51 kg/m     Wt Readings from Last 3 Encounters:  09/25/21 178 lb 3.2 oz (80.8 kg)  12/30/17 163 lb (73.9 kg)  12/28/17 163 lb 14.4 oz (74.3 kg)     GEN:  Well nourished, well developed in no acute distress HEENT: Normal NECK: No JVD; No carotid bruits LYMPHATICS: No lymphadenopathy CARDIAC: RRR, no murmurs, no rubs, no gallops RESPIRATORY:  Clear to auscultation without rales, wheezing or rhonchi  ABDOMEN:  Soft, non-tender, non-distended MUSCULOSKELETAL:  No edema; No deformity  SKIN: Warm and dry NEUROLOGIC:  Alert and oriented x 3 PSYCHIATRIC:  Normal affect   ASSESSMENT:    1. Abnormal electrocardiogram   2. Excessive daytime sleepiness   3. Ventricular tachycardia   4. Supraventricular tachycardia ([place])   5. Obstructive sleep apnea   6. HTN (hypertension), benign    PLAN:    In order of problems listed above:  Very complex arrhythmias which include ventricular tachycardia luckily only 4 beats in the room.  That required stratification.  I will schedule him to have echocardiogram to assess left ventricle ejection fraction.  He also have stress test to make sure there is no inducible ischemia however he has no symptomatology suggest coronary artery disease.  That needs  to be done for risk stratification.  States he is completely asymptomatic if echocardiogram and stress test are negative and overall that will be low risk. Supraventricular tachycardia which is quite frequent.  Also ventricular ectopy.  He does have bradycardia with average heart rate of 61.  I will give him very small dose of beta-blocker only 25 mg metoprolol titrate only for morning hours.  I also strongly suspect that he does have some obstructive sleep apnea his daughter told me he snores a lot when his wife was still alive she was telling him that he stopped breathing at night.  He will be scheduled to have a sleep study. Essential hypertension: Blood pressure seems to well controlled continue present management. Overall 6 complex clinical scenario I do not see any indication for pacemaker at this stage yet.  I suspect obstructive sleep apnea is responsible with significant bradycardia during the night.  Plan as described above   Medication Adjustments/Labs and Tests Ordered: Current medicines are reviewed at length with the patient today.  Concerns regarding medicines are outlined above.  Orders Placed This Encounter   Procedures   Ambulatory referral to Sleep Studies   MYOCARDIAL PERFUSION IMAGING   EKG 12-Lead   ECHOCARDIOGRAM COMPLETE   Meds ordered this encounter  Medications   metoprolol tartrate (LOPRESSOR) 25 MG tablet    Sig: Take 1 tablet (25 mg total) by mouth daily.    Dispense:  90 tablet    Refill:  1    Signed, Park Liter, MD, [facility]. 09/25/2021 4:24 PM    [place]

## 2021-10-01 ENCOUNTER — Telehealth (HOSPITAL_COMMUNITY): Payer: Self-pay | Admitting: *Deleted

## 2021-10-01 NOTE — Telephone Encounter (Signed)
Patient given detailed instructions per Myocardial Perfusion Study Information Sheet for the test on 10/08/21 at 7:45. Patient notified to arrive 15 minutes early and that it is imperative to arrive on time for appointment to keep from having the test rescheduled.  If you need to cancel or reschedule your appointment, please call the office within 24 hours of your appointment. . Patient verbalized understanding.Bradley Vasquez

## 2021-10-08 ENCOUNTER — Other Ambulatory Visit: Payer: Self-pay

## 2021-10-08 ENCOUNTER — Ambulatory Visit (INDEPENDENT_AMBULATORY_CARE_PROVIDER_SITE_OTHER): Payer: 59

## 2021-10-08 DIAGNOSIS — R9431 Abnormal electrocardiogram [ECG] [EKG]: Secondary | ICD-10-CM

## 2021-10-08 DIAGNOSIS — G4719 Other hypersomnia: Secondary | ICD-10-CM

## 2021-10-08 LAB — MYOCARDIAL PERFUSION IMAGING
LV dias vol: 110 mL (ref 62–150)
LV sys vol: 47 mL
Nuc Stress EF: 57 %
Peak HR: 65 {beats}/min
Rest HR: 41 {beats}/min
Rest Nuclear Isotope Dose: 8.6 mCi
SDS: 2
SRS: 2
SSS: 4
Stress Nuclear Isotope Dose: 27.2 mCi
TID: 1.11

## 2021-10-08 LAB — ECHOCARDIOGRAM COMPLETE
Area-P 1/2: 2.93 cm2
Height: 73 in
S' Lateral: 2.3 cm
Weight: 2848 oz

## 2021-10-08 MED ORDER — TECHNETIUM TC 99M TETROFOSMIN IV KIT
8.6000 | PACK | Freq: Once | INTRAVENOUS | Status: AC | PRN
  Administered 2021-10-08: 8.6 via INTRAVENOUS

## 2021-10-08 MED ORDER — TECHNETIUM TC 99M TETROFOSMIN IV KIT
27.2000 | PACK | Freq: Once | INTRAVENOUS | Status: AC | PRN
  Administered 2021-10-08: 27.2 via INTRAVENOUS

## 2021-10-08 MED ORDER — REGADENOSON 0.4 MG/5ML IV SOLN
0.4000 mg | Freq: Once | INTRAVENOUS | Status: AC
  Administered 2021-10-08: 0.4 mg via INTRAVENOUS

## 2021-10-10 ENCOUNTER — Telehealth: Payer: Self-pay

## 2021-10-10 NOTE — Telephone Encounter (Signed)
-----   Message from Park Liter, MD sent at 10/10/2021  1:07 PM EST ----- Echocardiogram showed normal left ventricle ejection fraction, mild dilatation of the ascending aorta measuring 39 mm, medical therapy, moderate mitral valve regurgitation all of this is for medical therapy

## 2021-10-10 NOTE — Telephone Encounter (Signed)
-----   Message from Park Liter, MD sent at 10/10/2021  1:07 PM EST ----- Normal stress test, normal ejection fraction

## 2021-10-10 NOTE — Telephone Encounter (Signed)
Spoke with Rosita Fire, informed of test results and verbalized understanding.

## 2021-10-13 ENCOUNTER — Telehealth: Payer: Self-pay | Admitting: Cardiology

## 2021-10-13 NOTE — Telephone Encounter (Signed)
Spoke with Otila Kluver (daughter),  she stated pt would like to know if he can resume normal active and would like to know if he needs to be seen sooner then feb?    Best number [phone]

## 2021-10-13 NOTE — Telephone Encounter (Signed)
Left message for patient daughter to return call.  

## 2021-10-14 NOTE — Telephone Encounter (Signed)
   Bradley Vasquez is returning call, she also wanted to ask Dr. Raliegh Ip if pt needs to continue taking metoprolol tartrate (LOPRESSOR) 25 MG tablet

## 2021-10-14 NOTE — Telephone Encounter (Signed)
Spoke to patient daughter per dpr. She states she thought this was more serious than this- she thought the patient needed a pacemaker. She wants some more clarification if this is still the plan or not. She wants to know if Dr. Agustin Cree thinks he NEEDS to be seen before February.  Advised her to have him continue metoprolol for now.

## 2021-10-15 NOTE — Telephone Encounter (Signed)
Spoke to patient daughter per dpr. Got patient scheduled.

## 2021-10-22 ENCOUNTER — Ambulatory Visit: Payer: 59 | Admitting: Cardiology

## 2021-11-03 ENCOUNTER — Ambulatory Visit: Payer: 59 | Admitting: Cardiology

## 2021-11-06 ENCOUNTER — Ambulatory Visit: Payer: 59 | Admitting: Cardiology

## 2022-01-08 ENCOUNTER — Other Ambulatory Visit: Payer: Self-pay

## 2022-01-08 ENCOUNTER — Ambulatory Visit (INDEPENDENT_AMBULATORY_CARE_PROVIDER_SITE_OTHER): Payer: 59

## 2022-01-08 ENCOUNTER — Encounter: Payer: Self-pay | Admitting: Cardiology

## 2022-01-08 ENCOUNTER — Ambulatory Visit: Payer: 59 | Admitting: Cardiology

## 2022-01-08 VITALS — BP 140/62 | HR 52 | Ht 73.0 in | Wt 166.6 lb

## 2022-01-08 DIAGNOSIS — I10 Essential (primary) hypertension: Secondary | ICD-10-CM

## 2022-01-08 DIAGNOSIS — R55 Syncope and collapse: Secondary | ICD-10-CM

## 2022-01-08 DIAGNOSIS — I472 Ventricular tachycardia, unspecified: Secondary | ICD-10-CM | POA: Diagnosis not present

## 2022-01-08 DIAGNOSIS — R42 Dizziness and giddiness: Secondary | ICD-10-CM

## 2022-01-08 DIAGNOSIS — I471 Supraventricular tachycardia: Secondary | ICD-10-CM

## 2022-01-08 NOTE — Patient Instructions (Signed)
Medication Instructions:  Your physician recommends that you continue on your current medications as directed. Please refer to the Current Medication list given to you today.  *If you need a refill on your cardiac medications before your next appointment, please call your pharmacy*   Lab Work: None If you have labs (blood work) drawn today and your tests are completely normal, you will receive your results only by: [place] (if you have MyChart) OR A paper copy in the mail If you have any lab test that is abnormal or we need to change your treatment, we will call you to review the results.   Testing/Procedures: A zio monitor was ordered today. It will remain on for 14 days. You will then return monitor and event diary in provided box. It takes 1-2 weeks for report to be downloaded and returned to [place]. We will call you with the results. If monitor falls off or has orange flashing light, please call Zio for further instructions.     Follow-Up: At [facility], you and your health needs are our priority.  As part of our continuing mission to provide you with exceptional heart care, we have created designated Provider Care Teams.  These Care Teams include your primary Cardiologist (physician) and Advanced Practice Providers (APPs -  Physician Assistants and Nurse Practitioners) who all work together to provide you with the care you need, when you need it.  We recommend signing up for the patient portal called "MyChart".  Sign up information is provided on this After Visit Summary.  MyChart is used to connect with patients for Virtual Visits (Telemedicine).  Patients are able to view lab/test results, encounter notes, upcoming appointments, etc.  Non-urgent messages can be sent to your provider as well.   To learn more about what you can do with MyChart, go to [email or web address].    Your next appointment:   3 month(s)  The format for your next appointment:   In  Person  Provider:   Jenne Campus, MD    Other Instructions None

## 2022-01-08 NOTE — Progress Notes (Signed)
Cardiology Office Note:    Date:  01/08/2022   ID:  Bradley Vasquez, DOB March 13, 1933, MRN [id number]  PCP:  Maryella Shivers, MD  Cardiologist:  Jenne Campus, MD    Referring MD: Maryella Shivers, MD   Chief Complaint  Patient presents with   Unstable balance     History of Present Illness:    Bradley Vasquez is a 88 y.o. male he was referred to [place] because of complex arrhythmia monitor he will show some supraventricular tachycardia as well as ventricular tachycardia.  Also some bradycardia but not critical.  We did stratification of his arrhythmia which included evaluation for coronary artery disease he did have a stress test that test came negative, he also got an echocardiogram echocardiogram show preserved left ventricle ejection fraction in spite of that he still have some episodes of dizziness he actually fell down twice he never passed out he said he just simply get very weak and goes on his knees.  Denies having palpitations when it happened.  I did put him on metoprolol asked them he said he feels better overall.  But still his symptomatology is concerning.  Past Medical History:  Diagnosis Date   Arthritis    Cataract    bilateral cateracts removed   Clotting disorder ([place]) 18 years ago   ITP   Dilated intrahepatic bile duct    Hepatitis    hepatitis b years ago ywhen in [employer]   HTN (hypertension), benign    Hyperbilirubinemia    Hypertrophy of prostate with urinary obstruction     Past Surgical History:  Procedure Laterality Date   CHOLECYSTECTOMY     COLONOSCOPY  2016   EYE SURGERY Bilateral    lens replacement for cataracts   HERNIA REPAIR Bilateral    INCISIONAL HERNIA REPAIR N/A 12/30/2017   Procedure: LAPAROSCOPIC INCISIONAL HERNIA WITH MESH  OPEN INCISIONAL HERNIA;  Surgeon: Erroll Luna, MD;  Location: [facility];  Service: General;  Laterality: N/A;   INSERTION OF MESH N/A 12/30/2017   Procedure: INSERTION OF MESH;  Surgeon: Erroll Luna, MD;  Location:  [place];  Service: General;  Laterality: N/A;   melanoma removed from face  10 years ago   PROSTATECTOMY N/A 06/13/2015   Procedure:  OPEN SURPAPUBIC PROSTATECTOMY, AND CYSTOLITHOLAPAXY ;  Surgeon: Carolan Clines, MD;  Location: [facility];  Service: Urology;  Laterality: N/A;    Current Medications: Current Meds  Medication Sig   escitalopram (LEXAPRO) 10 MG tablet Take 10 mg by mouth daily.   lisinopril-hydrochlorothiazide (PRINZIDE,ZESTORETIC) 20-12.5 MG tablet Take 1 tablet by mouth daily.   MELATONIN PO Take 1 tablet by mouth at bedtime. Unknown strength   metoprolol tartrate (LOPRESSOR) 25 MG tablet Take 1 tablet (25 mg total) by mouth daily.   Multiple Vitamin (MULTIVITAMIN) capsule Take 1 capsule by mouth daily. Unknown strength   OVER THE COUNTER MEDICATION Take 1 tablet by mouth daily. Prevagen With Lisinopril/ Unknown strength     Allergies:   Patient has no known allergies.   Social History   Socioeconomic History   Marital status: Single    Spouse name: Not on file   Number of children: Not on file   Years of education: Not on file   Highest education level: Not on file  Occupational History   Occupation: retired  Tobacco Use   Smoking status: Never   Smokeless tobacco: Never  Vaping Use   Vaping Use: Never used  Substance and Sexual Activity   Alcohol use: No  Alcohol/week: 0.0 standard drinks   Drug use: No   Sexual activity: Not on file  Other Topics Concern   Not on file  Social History Narrative   Not on file   Social Determinants of Health   Financial Resource Strain: Not on file  Food Insecurity: Not on file  Transportation Needs: Not on file  Physical Activity: Not on file  Stress: Not on file  Social Connections: Not on file     Family History: The patient's family history is negative for Colon cancer, Esophageal cancer, Rectal cancer, and Stomach cancer. ROS:   Please see the history of present illness.    All 14 point review of systems  negative except as described per history of present illness  EKGs/Labs/Other Studies Reviewed:      Recent Labs: No results found for requested labs within last 8760 hours.  Recent Lipid Panel No results found for: CHOL, TRIG, HDL, CHOLHDL, VLDL, LDLCALC, LDLDIRECT  Physical Exam:    VS:  BP 140/62 (BP Location: Right Arm, Patient Position: Sitting)    Pulse (!) 52    Ht 6\' 1"  (1.854 m)    Wt 166 lb 9.6 oz (75.6 kg)    SpO2 96%    BMI 21.98 kg/m     Wt Readings from Last 3 Encounters:  01/08/22 166 lb 9.6 oz (75.6 kg)  10/08/21 178 lb (80.7 kg)  09/25/21 178 lb 3.2 oz (80.8 kg)     GEN:  Well nourished, well developed in no acute distress HEENT: Normal NECK: No JVD; No carotid bruits LYMPHATICS: No lymphadenopathy CARDIAC: RRR, no murmurs, no rubs, no gallops RESPIRATORY:  Clear to auscultation without rales, wheezing or rhonchi  ABDOMEN: Soft, non-tender, non-distended MUSCULOSKELETAL:  No edema; No deformity  SKIN: Warm and dry LOWER EXTREMITIES: no swelling NEUROLOGIC:  Alert and oriented x 3 PSYCHIATRIC:  Normal affect   ASSESSMENT:    1. Ventricular tachycardia   2. Supraventricular tachycardia ([place])   3. HTN (hypertension), benign   4. Postural dizziness with presyncope    PLAN:    In order of problems listed above:  Complex ventricular and supraventricular arrhythmia.  Very small dose of beta-blocker has been given with limited improvement.  But still have episodes.  Stratification of ventricle tachycardia has been negative.  Plan will be to schedule him to have 2 weeks Zio patch make sure he does not have any inducible arrhythmia. Essential hypertension blood pressure well controlled continue present management he does have orthostasis he comes like always to be his daughter and we talked a lot about the fact that he need to drink plenty of water on a regular basis. Postural dizziness plan as described above.   Medication Adjustments/Labs and Tests  Ordered: Current medicines are reviewed at length with the patient today.  Concerns regarding medicines are outlined above.  No orders of the defined types were placed in this encounter.  Medication changes: No orders of the defined types were placed in this encounter.   Signed, Park Liter, MD, [facility] 01/08/2022 3:34 PM    [place]

## 2022-02-12 ENCOUNTER — Telehealth: Payer: Self-pay

## 2022-02-12 MED ORDER — METOPROLOL TARTRATE 25 MG PO TABS: 25.0000 mg | ORAL_TABLET | Freq: Every day | ORAL | 1 refills | Status: DC

## 2022-02-12 NOTE — Telephone Encounter (Signed)
-----   Message from Park Liter, MD sent at 02/12/2022 10:29 AM EDT ----- ?Arrhythmia still present but seems to be slightly better.  Continue present management ?

## 2022-02-12 NOTE — Telephone Encounter (Signed)
Spoke with Otila Kluver (POA/DPR) notified of results and recommendations. She asked to refill Metoprolol Tartrate to [facility] on [address]. Medication sent.  ?

## 2022-04-08 ENCOUNTER — Ambulatory Visit: Payer: 59 | Admitting: Cardiology

## 2022-11-24 ENCOUNTER — Other Ambulatory Visit: Payer: Self-pay | Admitting: Cardiology

## 2022-12-16 ENCOUNTER — Other Ambulatory Visit: Payer: Self-pay | Admitting: Cardiology

## 2023-04-13 ENCOUNTER — Telehealth: Payer: Self-pay | Admitting: Cardiology

## 2023-04-13 NOTE — Telephone Encounter (Signed)
Patient's daughter is calling because the patient stated that the patient stated they feel a vibration right above his heart. Patient's daughter stated that the patient stated that its not like a muscle spasm. Patient has been checking his BP, but the BP has been normal per daughter. Please advise.

## 2023-04-13 NOTE — Telephone Encounter (Signed)
Spoke with Bradley Vasquez per DPR who states the pt has been having a "vibration" sensation in his left upper chest for 2 weeks that is only noticeable when he sits down. Denies chest pain, dizziness or shortness of breath. Please advise

## 2023-04-19 NOTE — Telephone Encounter (Signed)
No vm set up. 

## 2023-04-20 NOTE — Telephone Encounter (Signed)
Pt has no associated sx with "vibration". Vibration is only felt when sitting down and not related to his breathing. Dgt states he did not mention sx yesterday. Inetta Fermo states they will see his PCP. Advised if PCP feels he needs to be seen let us know and we will get him scheduled.
